# Patient Record
Sex: Female | Born: 1970 | Race: White | Hispanic: No | Marital: Married | Smoking: Never smoker
Health system: Southern US, Community
[De-identification: ages and names within clinical notes are randomized; demographics above are authoritative.]

## PROBLEM LIST (undated history)

## (undated) DIAGNOSIS — T7840XA Allergy, unspecified, initial encounter: Secondary | ICD-10-CM

## (undated) DIAGNOSIS — E079 Disorder of thyroid, unspecified: Secondary | ICD-10-CM

## (undated) DIAGNOSIS — D649 Anemia, unspecified: Secondary | ICD-10-CM

## (undated) DIAGNOSIS — G43909 Migraine, unspecified, not intractable, without status migrainosus: Secondary | ICD-10-CM

## (undated) DIAGNOSIS — I10 Essential (primary) hypertension: Secondary | ICD-10-CM

## (undated) DIAGNOSIS — R011 Cardiac murmur, unspecified: Secondary | ICD-10-CM

## (undated) DIAGNOSIS — F419 Anxiety disorder, unspecified: Secondary | ICD-10-CM

## (undated) HISTORY — PX: TUBAL LIGATION: SHX77

## (undated) HISTORY — PX: WISDOM TOOTH EXTRACTION: SHX21

## (undated) HISTORY — DX: Anxiety disorder, unspecified: F41.9

## (undated) HISTORY — DX: Migraine, unspecified, not intractable, without status migrainosus: G43.909

## (undated) HISTORY — DX: Disorder of thyroid, unspecified: E07.9

## (undated) HISTORY — DX: Allergy, unspecified, initial encounter: T78.40XA

## (undated) HISTORY — DX: Anemia, unspecified: D64.9

## (undated) HISTORY — DX: Cardiac murmur, unspecified: R01.1

## (undated) HISTORY — DX: Essential (primary) hypertension: I10

---

## 1998-07-31 ENCOUNTER — Other Ambulatory Visit: Admission: RE | Admit: 1998-07-31 | Discharge: 1998-07-31 | Payer: Self-pay | Admitting: Obstetrics and Gynecology

## 1999-03-01 ENCOUNTER — Encounter: Admission: RE | Admit: 1999-03-01 | Discharge: 1999-03-01 | Payer: Self-pay | Admitting: *Deleted

## 2000-10-29 ENCOUNTER — Other Ambulatory Visit: Admission: RE | Admit: 2000-10-29 | Discharge: 2000-10-29 | Payer: Self-pay | Admitting: Obstetrics and Gynecology

## 2001-05-14 ENCOUNTER — Inpatient Hospital Stay (HOSPITAL_COMMUNITY): Admission: AD | Admit: 2001-05-14 | Discharge: 2001-05-17 | Payer: Self-pay | Admitting: Obstetrics and Gynecology

## 2001-07-08 ENCOUNTER — Other Ambulatory Visit: Admission: RE | Admit: 2001-07-08 | Discharge: 2001-07-08 | Payer: Self-pay | Admitting: Obstetrics and Gynecology

## 2001-09-07 ENCOUNTER — Emergency Department (HOSPITAL_COMMUNITY): Admission: EM | Admit: 2001-09-07 | Discharge: 2001-09-07 | Payer: Self-pay | Admitting: Emergency Medicine

## 2005-02-27 ENCOUNTER — Inpatient Hospital Stay (HOSPITAL_COMMUNITY): Admission: AD | Admit: 2005-02-27 | Discharge: 2005-02-27 | Payer: Self-pay | Admitting: Obstetrics and Gynecology

## 2005-03-04 ENCOUNTER — Other Ambulatory Visit: Admission: RE | Admit: 2005-03-04 | Discharge: 2005-03-04 | Payer: Self-pay | Admitting: Obstetrics and Gynecology

## 2005-08-29 ENCOUNTER — Inpatient Hospital Stay (HOSPITAL_COMMUNITY): Admission: AD | Admit: 2005-08-29 | Discharge: 2005-08-29 | Payer: Self-pay | Admitting: Obstetrics and Gynecology

## 2005-09-01 ENCOUNTER — Inpatient Hospital Stay (HOSPITAL_COMMUNITY): Admission: AD | Admit: 2005-09-01 | Discharge: 2005-09-01 | Payer: Self-pay | Admitting: Obstetrics and Gynecology

## 2005-09-03 ENCOUNTER — Inpatient Hospital Stay (HOSPITAL_COMMUNITY): Admission: AD | Admit: 2005-09-03 | Discharge: 2005-09-03 | Payer: Self-pay | Admitting: Obstetrics and Gynecology

## 2005-09-08 ENCOUNTER — Inpatient Hospital Stay (HOSPITAL_COMMUNITY): Admission: AD | Admit: 2005-09-08 | Discharge: 2005-09-11 | Payer: Self-pay | Admitting: Obstetrics and Gynecology

## 2005-09-12 ENCOUNTER — Encounter: Admission: RE | Admit: 2005-09-12 | Discharge: 2005-10-11 | Payer: Self-pay | Admitting: Obstetrics and Gynecology

## 2005-09-15 ENCOUNTER — Inpatient Hospital Stay (HOSPITAL_COMMUNITY): Admission: AD | Admit: 2005-09-15 | Discharge: 2005-09-17 | Payer: Self-pay | Admitting: Obstetrics and Gynecology

## 2005-10-12 ENCOUNTER — Encounter: Admission: RE | Admit: 2005-10-12 | Discharge: 2005-11-11 | Payer: Self-pay | Admitting: Obstetrics and Gynecology

## 2005-10-16 LAB — HM PAP SMEAR: HM Pap smear: NEGATIVE

## 2005-11-12 ENCOUNTER — Encounter: Admission: RE | Admit: 2005-11-12 | Discharge: 2005-12-12 | Payer: Self-pay | Admitting: Obstetrics and Gynecology

## 2007-03-23 IMAGING — US US FETAL BPP W/O NONSTRESS
1 series · 14 of 28 positions shown · non-contrast
Comparison: none

CLINICAL DATA: Hypertension.  Edema.

[Series 1: us fetal bpp w/o nonstress · 0.41mm/px · 14 of 35 slices shown]
[im 2/35]
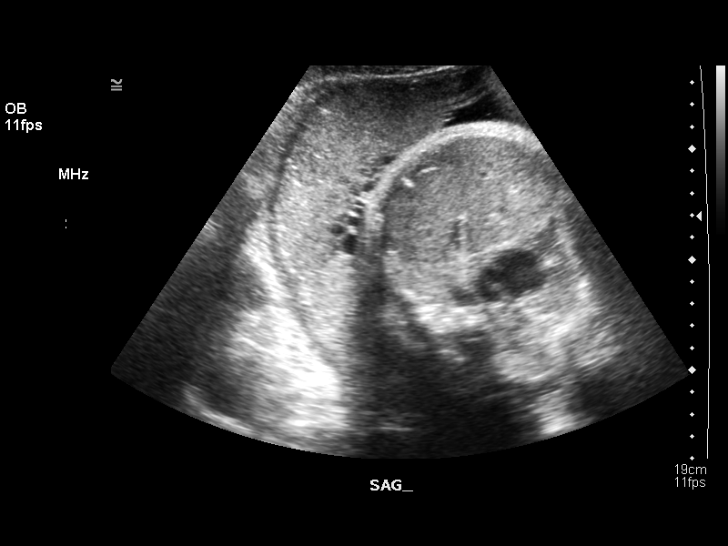
[im 4/35]
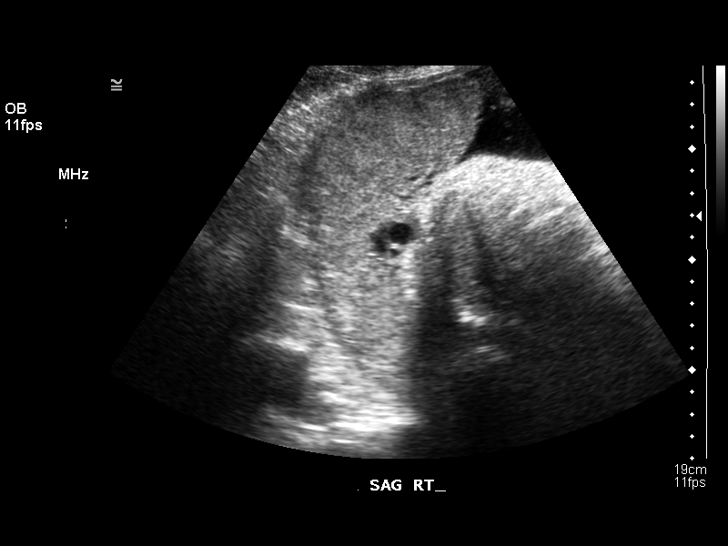
[im 7/35]
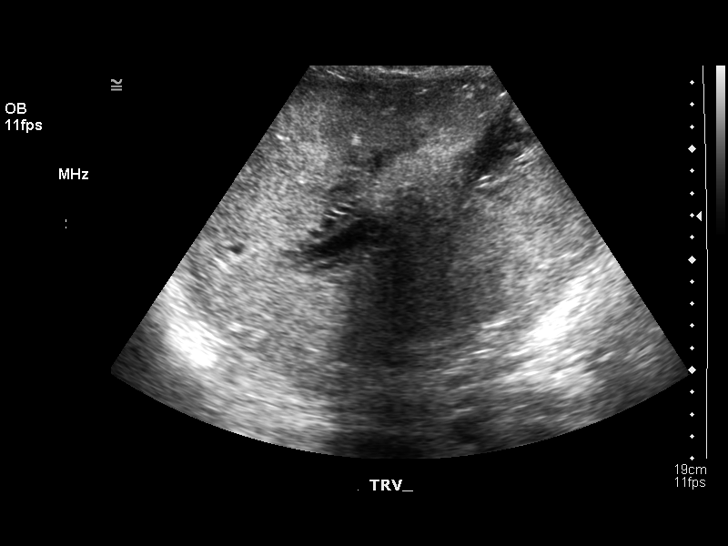
[im 9/35]
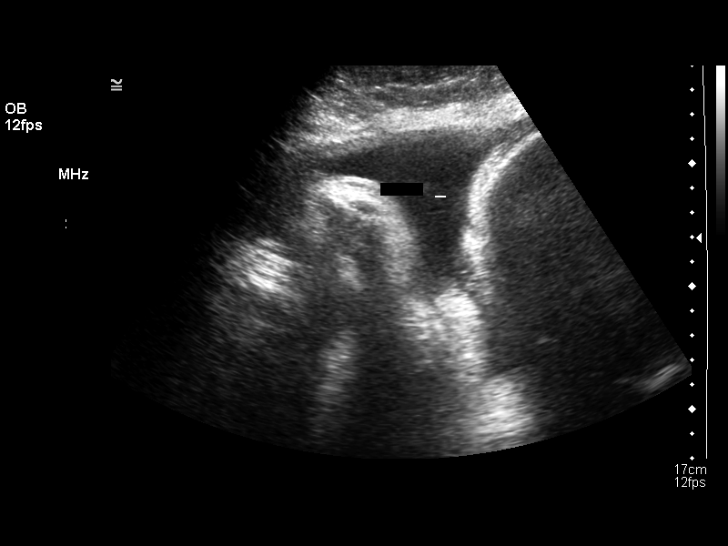
[im 12/35]
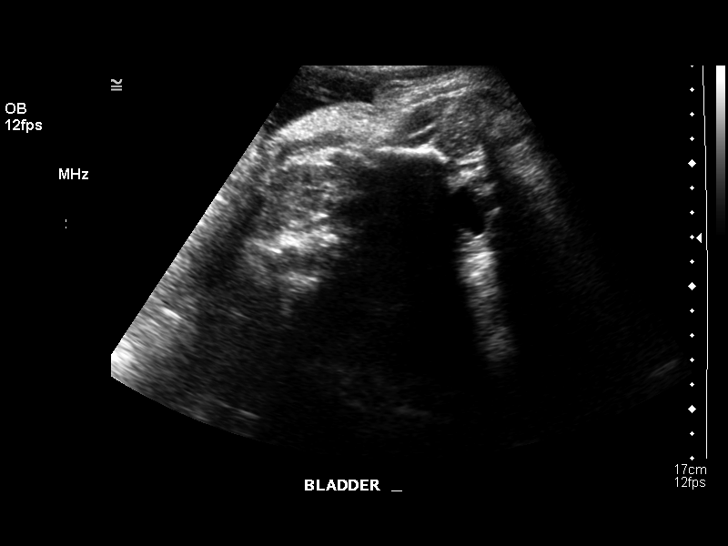
[im 14/35]
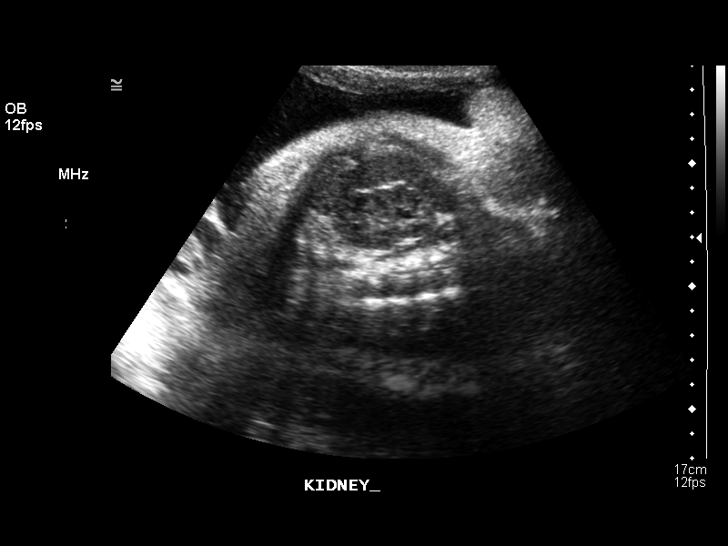
[im 17/35]
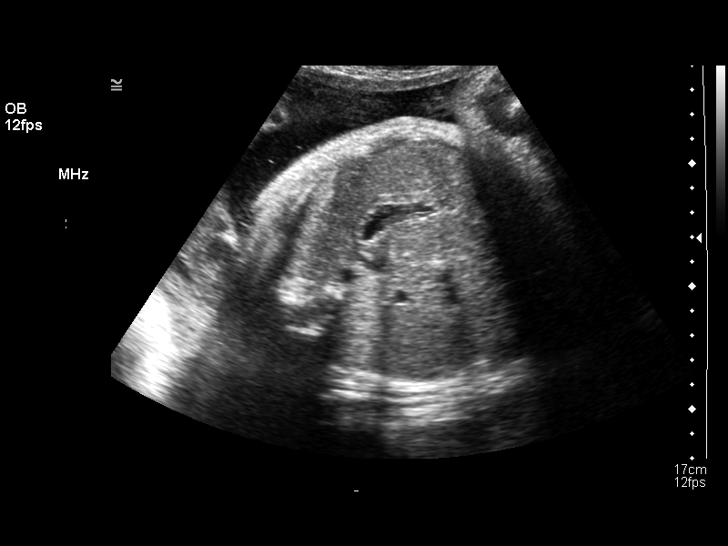
[im 19/35]
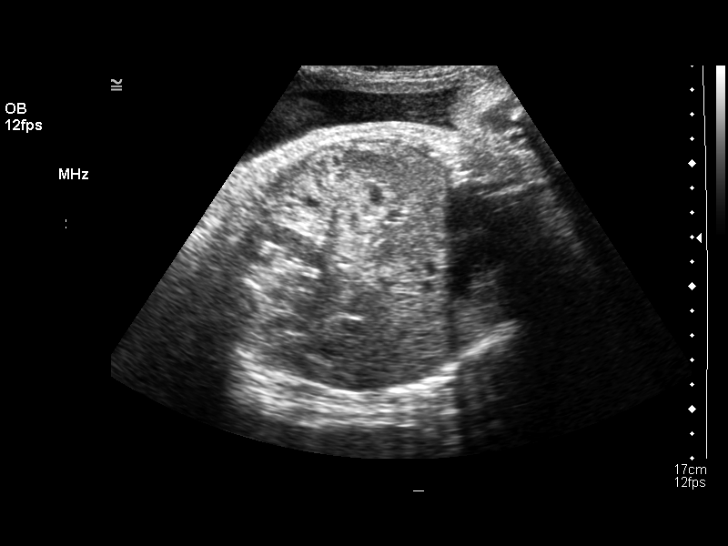
[im 22/35]
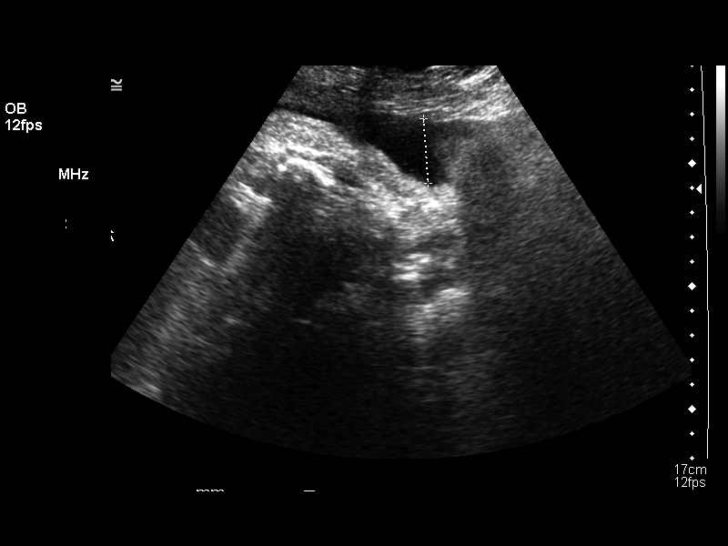
[im 24/35]
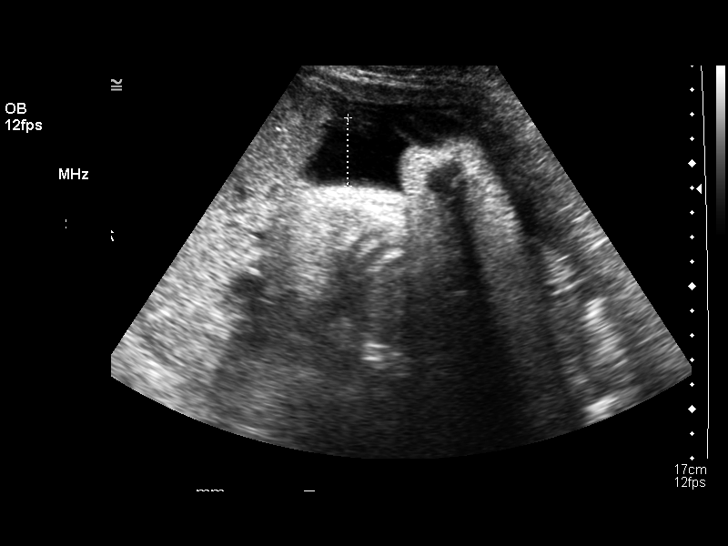
[im 27/35]
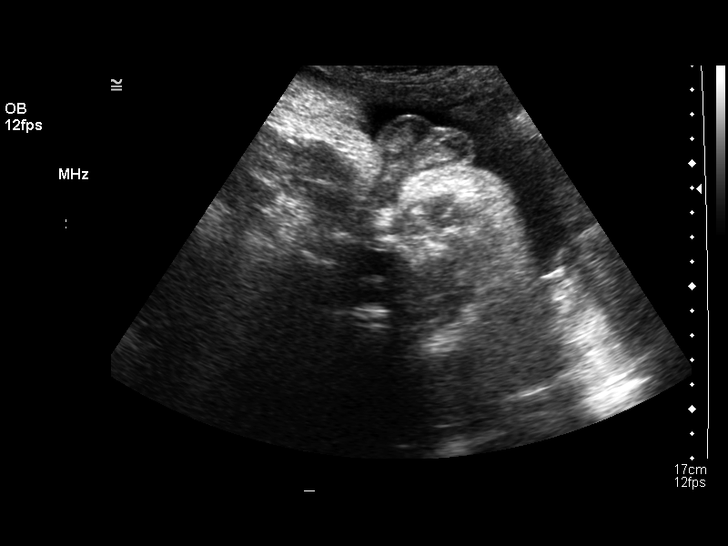
[im 29/35]
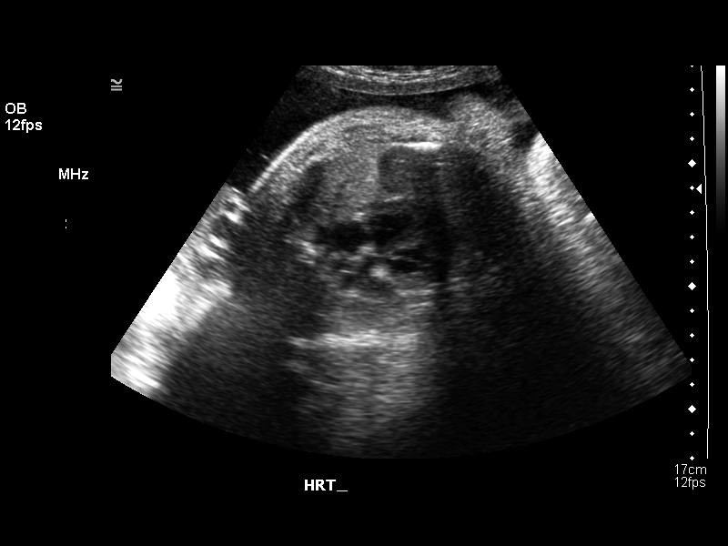
[im 32/35]
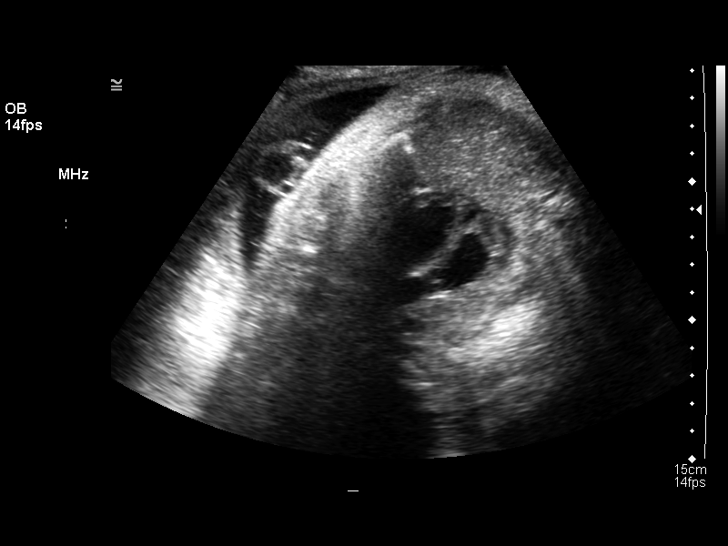
[im 35/35]
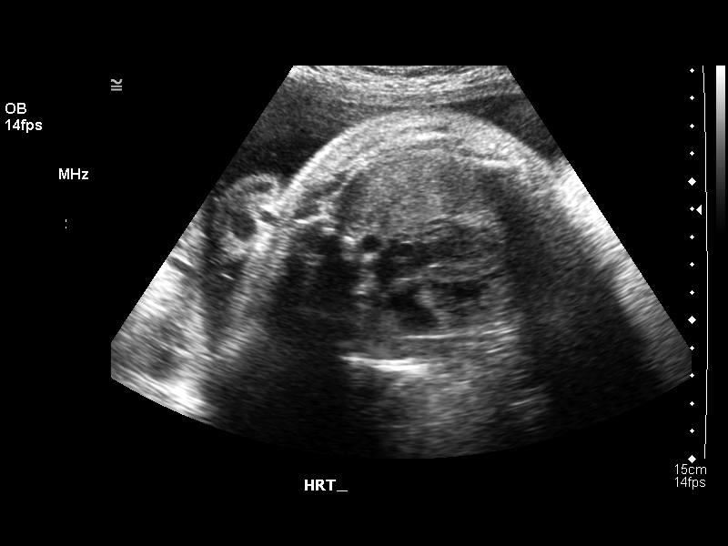

[14 of 28 positions shown; findings below may reference images not displayed]

BIOPHYSICAL PROFILE

 Number of Fetuses: 1
 Heart rate:  126
 Movement:  Yes
 Breathing:  Yes
 Presentation:  Cephalic
 Placental Location:  Fundal, posterior
 Grade: II
 Previa:  No
 Amniotic Fluid (Subjective):  Normal
 Amniotic Fluid (Objective):  10.7 cm AFI (5th -95th%ile = 7.7 ? 24.9 cm for 36 wks)

 Fetal measurements and complete anatomic evaluation were not requested.  The following fetal anatomy was visualized on this exam: Lateral ventricles, four chamber heart, stomach, 3-vessel cord, kidneys, bladder, LVOT, RVOT, orbits, diaphragm, and male genitalia.

 BPP SCORING
 Movements:  2  Time:  10 minutes
 Breathing:  2
 Tone:  2
 Amniotic Fluid: 2
 Total Score: 8

 MATERNAL UTERINE AND ADNEXAL FINDINGS
 Cervix: Not evaluated > 34 wks
IMPRESSION: 1.  Single living intrauterine fetus in cephalic presentation with subjectively and quantitatively normal amniotic fluid volume.
 2.  Visualized fetal anatomy is unremarkable although assessment is limited by fetal position and advanced gestational age.
 3.  Biophysical profile score is [DATE] after 10 minute of observation.

## 2016-06-19 ENCOUNTER — Telehealth: Payer: Self-pay | Admitting: Family

## 2016-06-19 DIAGNOSIS — J329 Chronic sinusitis, unspecified: Secondary | ICD-10-CM

## 2016-06-19 DIAGNOSIS — B9689 Other specified bacterial agents as the cause of diseases classified elsewhere: Secondary | ICD-10-CM

## 2016-06-19 MED ORDER — AMOXICILLIN-POT CLAVULANATE 875-125 MG PO TABS: 1.0000 | ORAL_TABLET | Freq: Two times a day (BID) | ORAL | 0 refills | Status: AC

## 2016-06-19 NOTE — Progress Notes (Signed)

## 2016-09-05 ENCOUNTER — Telehealth: Payer: Self-pay | Admitting: Nurse Practitioner

## 2016-09-05 DIAGNOSIS — J0101 Acute recurrent maxillary sinusitis: Secondary | ICD-10-CM

## 2016-09-05 MED ORDER — DOXYCYCLINE HYCLATE 100 MG PO TABS: 100.0000 mg | ORAL_TABLET | Freq: Two times a day (BID) | ORAL | 0 refills | Status: DC

## 2016-09-05 NOTE — Progress Notes (Signed)
We are sorry that you are not feeling well.  Here is how we plan to help!  Based on what you have shared with me it looks like you have sinusitis.  Sinusitis is inflammation and infection in the sinus cavities of the head.  Based on your presentation I believe you most likely have Acute Bacterial Sinusitis.  This is an infection caused by bacteria and is treated with antibiotics. I have prescribed Doxycycline 137m by mouth twice a day for 10 days. You may use an oral decongestant such as Mucinex D or if you have glaucoma or high blood pressure use plain Mucinex. Saline nasal spray help and can safely be used as often as needed for congestion.  If you develop worsening sinus pain, fever or notice severe headache and vision changes, or if symptoms are not better after completion of antibiotic, please schedule an appointment with a health care provider.    Sinus infections are not as easily transmitted as other respiratory infection, however we still recommend that you avoid close contact with loved ones, especially the very young and elderly.  Remember to wash your hands thoroughly throughout the day as this is the number one way to prevent the spread of infection!  Home Care:  Only take medications as instructed by your medical team.  Complete the entire course of an antibiotic.  Do not take these medications with alcohol.  A steam or ultrasonic humidifier can help congestion.  You can place a towel over your head and breathe in the steam from hot water coming from a faucet.  Avoid close contacts especially the very young and the elderly.  Cover your mouth when you cough or sneeze.  Always remember to wash your hands.  Get Help Right Away If:  You develop worsening fever or sinus pain.  You develop a severe head ache or visual changes.  Your symptoms persist after you have completed your treatment plan.  Make sure you  Understand these instructions.  Will watch your  condition.  Will get help right away if you are not doing well or get worse.  Your e-visit answers were reviewed by a board certified advanced clinical practitioner to complete your personal care plan.  Depending on the condition, your plan could have included both over the counter or prescription medications.  If there is a problem please reply  once you have received a response from your provider.  Your safety is important to [place]  If you have drug allergies check your prescription carefully.    You can use MyChart to ask questions about today's visit, request a non-urgent call back, or ask for a work or school excuse for 24 hours related to this e-Visit. If it has been greater than 24 hours you will need to follow up with your provider, or enter a new e-Visit to address those concerns.  You will get an e-mail in the next two days asking about your experience.  I hope that your e-visit has been valuable and will speed your recovery. Thank you for using e-visits.

## 2016-12-25 ENCOUNTER — Other Ambulatory Visit: Payer: Self-pay | Admitting: Family Medicine

## 2017-01-07 ENCOUNTER — Ambulatory Visit (INDEPENDENT_AMBULATORY_CARE_PROVIDER_SITE_OTHER): Payer: Self-pay | Admitting: Adult Health

## 2017-01-07 ENCOUNTER — Encounter: Payer: Self-pay | Admitting: Adult Health

## 2017-01-07 VITALS — BP 143/89 | HR 88 | Ht 64.5 in | Wt 193.1 lb

## 2017-01-07 DIAGNOSIS — Z0000 Encounter for general adult medical examination without abnormal findings: Secondary | ICD-10-CM | POA: Diagnosis not present

## 2017-01-07 DIAGNOSIS — G43901 Migraine, unspecified, not intractable, with status migrainosus: Secondary | ICD-10-CM | POA: Diagnosis not present

## 2017-01-07 MED ORDER — TOPIRAMATE 25 MG PO TABS: 25.0000 mg | ORAL_TABLET | Freq: Two times a day (BID) | ORAL | 0 refills | Status: DC

## 2017-01-07 MED ORDER — SUMATRIPTAN SUCCINATE 100 MG PO TABS: 100.0000 mg | ORAL_TABLET | ORAL | 1 refills | Status: DC | PRN

## 2017-01-07 NOTE — Patient Instructions (Signed)
Heart-Healthy Eating Plan Many factors influence your heart health, including eating and exercise habits. Heart (coronary) risk increases with abnormal blood fat (lipid) levels. Heart-healthy meal planning includes limiting unhealthy fats, increasing healthy fats, and making other small dietary changes. This includes maintaining a healthy body weight to help keep lipid levels within a normal range. What is my plan? Your health care provider recommends that you:  Get no more than __25__% of the total calories in your daily diet from fat.  Limit your intake of saturated fat to less than __5__% of your total calories each day.  Limit the amount of cholesterol in your diet to less than __300__ mg per day.  What types of fat should I choose?  Choose healthy fats more often. Choose monounsaturated and polyunsaturated fats, such as olive oil and canola oil, flaxseeds, walnuts, almonds, and seeds.  Eat more omega-3 fats. Good choices include salmon, mackerel, sardines, tuna, flaxseed oil, and ground flaxseeds. Aim to eat fish at least two times each week.  Limit saturated fats. Saturated fats are primarily found in animal products, such as meats, butter, and cream. Plant sources of saturated fats include palm oil, palm kernel oil, and coconut oil.  Avoid foods with partially hydrogenated oils in them. These contain trans fats. Examples of foods that contain trans fats are stick margarine, some tub margarines, cookies, crackers, and other baked goods. What general guidelines do I need to follow?  Check food labels carefully to identify foods with trans fats or high amounts of saturated fat.  Fill one half of your plate with vegetables and green salads. Eat 4-5 servings of vegetables per day. A serving of vegetables equals 1 cup of raw leafy vegetables,  cup of raw or cooked cut-up vegetables, or  cup of vegetable juice.  Fill one fourth of your plate with whole grains. Look for the word "whole" as  the first word in the ingredient list.  Fill one fourth of your plate with lean protein foods.  Eat 4-5 servings of fruit per day. A serving of fruit equals one medium whole fruit,  cup of dried fruit,  cup of fresh, frozen, or canned fruit, or  cup of 100% fruit juice.  Eat more foods that contain soluble fiber. Examples of foods that contain this type of fiber are apples, broccoli, carrots, beans, peas, and barley. Aim to get 20-30 g of fiber per day.  Eat more home-cooked food and less restaurant, buffet, and fast food.  Limit or avoid alcohol.  Limit foods that are high in starch and sugar.  Avoid fried foods.  Cook foods by using methods other than frying. Baking, boiling, grilling, and broiling are all great options. Other fat-reducing suggestions include: ? Removing the skin from poultry. ? Removing all visible fats from meats. ? Skimming the fat off of stews, soups, and gravies before serving them. ? Steaming vegetables in water or broth.  Lose weight if you are overweight. Losing just 5-10% of your initial body weight can help your overall health and prevent diseases such as diabetes and heart disease.  Increase your consumption of nuts, legumes, and seeds to 4-5 servings per week. One serving of dried beans or legumes equals  cup after being cooked, one serving of nuts equals 1 ounces, and one serving of seeds equals  ounce or 1 tablespoon.  You may need to monitor your salt (sodium) intake, especially if you have high blood pressure. Talk with your health care provider or dietitian to get  more information about reducing sodium. What foods can I eat? Grains  Breads, including [place], white, pita, wheat, raisin, rye, oatmeal, and [place]. Tortillas that are neither fried nor made with lard or trans fat. Low-fat rolls, including hotdog and hamburger buns and English muffins. Biscuits. Muffins. Waffles. Pancakes. Light popcorn. Whole-grain cereals. Flatbread. Melba toast.  Pretzels. Breadsticks. Rusks. Low-fat snacks and crackers, including oyster, saltine, matzo, graham, animal, and rye. Rice and pasta, including brown rice and those that are made with whole wheat. Vegetables All vegetables. Fruits All fruits, but limit coconut. Meats and Other Protein Sources Lean, well-trimmed beef, veal, pork, and lamb. Chicken and [place] without skin. All fish and shellfish. Wild duck, rabbit, pheasant, and venison. Egg whites or low-cholesterol egg substitutes. Dried beans, peas, lentils, and tofu.Seeds and most nuts. Dairy Low-fat or nonfat cheeses, including ricotta, string, and mozzarella. Skim or 1% milk that is liquid, powdered, or evaporated. Buttermilk that is made with low-fat milk. Nonfat or low-fat yogurt. Beverages Mineral water. Diet carbonated beverages. Sweets and Desserts Sherbets and fruit ices. Honey, jam, marmalade, jelly, and syrups. Meringues and gelatins. Pure sugar candy, such as hard candy, jelly beans, gumdrops, mints, marshmallows, and small amounts of dark chocolate. [employer] Eat all sweets and desserts in moderation. Fats and Oils Nonhydrogenated (trans-free) margarines. Vegetable oils, including soybean, sesame, sunflower, olive, peanut, safflower, corn, canola, and cottonseed. Salad dressings or mayonnaise that are made with a vegetable oil. Limit added fats and oils that you use for cooking, baking, salads, and as spreads. Other Cocoa powder. Coffee and tea. All seasonings and condiments. The items listed above may not be a complete list of recommended foods or beverages. Contact your dietitian for more options. What foods are not recommended? Grains Breads that are made with saturated or trans fats, oils, or whole milk. Croissants. Butter rolls. Cheese breads. Sweet rolls. Donuts. Buttered popcorn. Chow mein noodles. High-fat crackers, such as cheese or butter crackers. Meats and Other Protein Sources Fatty meats, such as hotdogs,  short ribs, sausage, spareribs, bacon, ribeye roast or steak, and mutton. High-fat deli meats, such as salami and bologna. Caviar. Domestic duck and goose. Organ meats, such as kidney, liver, sweetbreads, brains, gizzard, chitterlings, and heart. Dairy Cream, sour cream, cream cheese, and creamed cottage cheese. Whole milk cheeses, including blue (bleu), Monterey Jack, [place], [place], American, [place], Swiss, [place], [place], and [place]. Whole or 2% milk that is liquid, evaporated, or condensed. Whole buttermilk. Cream sauce or high-fat cheese sauce. Yogurt that is made from whole milk. Beverages Regular sodas and drinks with added sugar. Sweets and Desserts Frosting. Pudding. Cookies. Cakes other than angel food cake. Candy that has milk chocolate or white chocolate, hydrogenated fat, butter, coconut, or unknown ingredients. Buttered syrups. Full-fat ice cream or ice cream drinks. Fats and Oils Gravy that has suet, meat fat, or shortening. Cocoa butter, hydrogenated oils, palm oil, coconut oil, palm kernel oil. These can often be found in baked products, candy, fried foods, nondairy creamers, and whipped toppings. Solid fats and shortenings, including bacon fat, salt pork, lard, and butter. Nondairy cream substitutes, such as coffee creamers and sour cream substitutes. Salad dressings that are made of unknown oils, cheese, or sour cream. The items listed above may not be a complete list of foods and beverages to avoid. Contact your dietitian for more information. This information is not intended to replace advice given to you by your health care provider. Make sure you discuss any questions you have with your health care  provider. Document Released: 12/11/2007 Document Revised: 09/21/2015 Document Reviewed: 08/25/2013 Elsevier Interactive Patient Education  2017 [place].   Migraine Headache A migraine headache is an intense, throbbing pain on one side or both sides of the head. Migraines  may also cause other symptoms, such as nausea, vomiting, and sensitivity to light and noise. What are the causes? Doing or taking certain things may also trigger migraines, such as:  Alcohol.  Smoking.  Medicines, such as: ? Medicine used to treat chest pain (nitroglycerine). ? Birth control pills. ? Estrogen pills. ? Certain blood pressure medicines.  Aged cheeses, chocolate, or caffeine.  Foods or drinks that contain nitrates, glutamate, aspartame, or tyramine.  Physical activity.  Other things that may trigger a migraine include:  Menstruation.  Pregnancy.  Hunger.  Stress, lack of sleep, too much sleep, or fatigue.  Weather changes.  What increases the risk? The following factors may make you more likely to experience migraine headaches:  Age. Risk increases with age.  Family history of migraine headaches.  Being Caucasian.  Depression and anxiety.  Obesity.  Being a woman.  Having a hole in the heart (patent foramen ovale) or other heart problems.  What are the signs or symptoms? The main symptom of this condition is pulsating or throbbing pain. Pain may:  Happen in any area of the head, such as on one side or both sides.  Interfere with daily activities.  Get worse with physical activity.  Get worse with exposure to bright lights or loud noises.  Other symptoms may include:  Nausea.  Vomiting.  Dizziness.  General sensitivity to bright lights, loud noises, or smells.  Before you get a migraine, you may get warning signs that a migraine is developing (aura). An aura may include:  Seeing flashing lights or having blind spots.  Seeing bright spots, halos, or zigzag lines.  Having tunnel vision or blurred vision.  Having numbness or a tingling feeling.  Having trouble talking.  Having muscle weakness.  How is this diagnosed? A migraine headache can be diagnosed based on:  Your symptoms.  A physical exam.  Tests, such as CT  scan or MRI of the head. These imaging tests can help rule out other causes of headaches.  Taking fluid from the spine (lumbar puncture) and analyzing it (cerebrospinal fluid analysis, or CSF analysis).  How is this treated? A migraine headache is usually treated with medicines that:  Relieve pain.  Relieve nausea.  Prevent migraines from coming back.  Treatment may also include:  Acupuncture.  Lifestyle changes like avoiding foods that trigger migraines.  Follow these instructions at home: Medicines  Take over-the-counter and prescription medicines only as told by your health care provider.  Do not drive or use heavy machinery while taking prescription pain medicine.  To prevent or treat constipation while you are taking prescription pain medicine, your health care provider may recommend that you: ? Drink enough fluid to keep your urine clear or pale yellow. ? Take over-the-counter or prescription medicines. ? Eat foods that are high in fiber, such as fresh fruits and vegetables, whole grains, and beans. ? Limit foods that are high in fat and processed sugars, such as fried and sweet foods. Lifestyle  Avoid alcohol use.  Do not use any products that contain nicotine or tobacco, such as cigarettes and e-cigarettes. If you need help quitting, ask your health care provider.  Get at least 8 hours of sleep every night.  Limit your stress. General instructions  Keep a journal to find out what may trigger your migraine headaches. For example, write down: ? What you eat and drink. ? How much sleep you get. ? Any change to your diet or medicines.  If you have a migraine: ? Avoid things that make your symptoms worse, such as bright lights. ? It may help to lie down in a dark, quiet room. ? Do not drive or use heavy machinery. ? Ask your health care provider what activities are safe for you while you are experiencing symptoms.  Keep all follow-up visits as told by your  health care provider. This is important. Contact a health care provider if:  You develop symptoms that are different or more severe than your usual migraine symptoms. Get help right away if:  Your migraine becomes severe.  You have a fever.  You have a stiff neck.  You have vision loss.  Your muscles feel weak or like you cannot control them.  You start to lose your balance often.  You develop trouble walking.  You faint. This information is not intended to replace advice given to you by your health care provider. Make sure you discuss any questions you have with your health care provider. Document Released: 03/03/2005 Document Revised: 09/21/2015 Document Reviewed: 08/20/2015 Elsevier Interactive Patient Education  2017 [place] your excellent water intake and follow heart healthy diet. To break through weight loss plateau, recommend  HITT workout 3 times a week in addition to current exercise regime. Please start Topiramate (Topamax) 30m- one tablet nightly for first week, then increase to twice daily week two. Follow-up in 4 weeks. WELCOME TO THE PRACTICE!

## 2017-01-07 NOTE — Progress Notes (Signed)
Subjective:    Patient ID: Susan Barber, female    DOB: 1970/12/21, 46 y.o.   MRN: [id number]  HPI:  Susan Barber is here to establish as a new pt.  She is a very pleasant 46 year old female.  PMH: Migraine HA- estimates >6 HAs/month.  Triggers are salt, stress, and hormonal shifts prior to menses.  She usee Sumatriptan for abortive therapy and has been on topiramate in the past and it worked well for HA prevention. She was managed by [facility], last contact was >2 years ago. She started a regular fitness program and started low cho/high protein diet Feb 2018 and had lost 76lbs-WOW! She performs cardio and wt lifting 5 days a week, however feels that she has hit a plateau with wt loss. She estimates to drink >100 oz water/day. She denies tobacco/ETOH use. She has three children and is a Physicist, medical at local elementary school.  Patient Care Team    Relationship Specialty Notifications Start End  Esaw Grandchild, NP PCP - General Family Medicine  12/25/16     Patient Active Problem List   Diagnosis Date Noted  . Migraine with status migrainosus, not intractable 01/07/2017  . Healthcare maintenance 01/07/2017     Past Medical History:  Diagnosis Date  . Hypertension   . Migraines   . Thyroid disease      Past Surgical History:  Procedure Laterality Date  . CESAREAN SECTION       Family History  Problem Relation Age of Onset  . Depression Mother   . Diabetes Mother   . Hyperlipidemia Mother   . Hypertension Mother   . Alcohol abuse Father   . Hyperlipidemia Maternal Grandmother   . Hypertension Maternal Grandmother   . Cancer Maternal Grandfather        lymphoma  . Heart attack Maternal Grandfather   . Depression Maternal Grandfather   . Hyperlipidemia Maternal Grandfather   . Hypertension Maternal Grandfather      History  Drug Use No     History  Alcohol Use No     History  Smoking Status  . Never Smoker  Smokeless Tobacco  . Never  Used     Outpatient Encounter Prescriptions as of 01/07/2017  Medication Sig  . Collagen Hydrolysate, Bovine, POWD by Does not apply route. One tablespoon twice daily  . SUMAtriptan (IMITREX) 100 MG tablet Take 1 tablet (100 mg total) by mouth every 2 (two) hours as needed for migraine. May repeat in 2 hours if headache persists or recurs.  . [DISCONTINUED] SUMAtriptan (IMITREX) 100 MG tablet Take 100 mg by mouth every 2 (two) hours as needed for migraine. May repeat in 2 hours if headache persists or recurs.  . topiramate (TOPAMAX) 25 MG tablet Take 1 tablet (25 mg total) by mouth 2 (two) times daily.  . [DISCONTINUED] doxycycline (VIBRA-TABS) 100 MG tablet Take 1 tablet (100 mg total) by mouth 2 (two) times daily. 1 po bid   No facility-administered encounter medications on file as of 01/07/2017.     Allergies: Beeswax and Codeine  Body mass index is 32.63 kg/m.  Blood pressure (!) 143/89, pulse 88, height 5' 4.5" (1.638 m), weight 193 lb 1.6 oz (87.6 kg), last menstrual period 12/09/2016.     Review of Systems  Constitutional: Positive for fatigue. Negative for activity change, appetite change, chills, diaphoresis, fever and unexpected weight change.  Eyes: Negative for visual disturbance.  Respiratory: Negative for cough, chest tightness, shortness of  breath, wheezing and stridor.   Cardiovascular: Negative for chest pain, palpitations and leg swelling.  Gastrointestinal: Negative for abdominal distention, abdominal pain, blood in stool, constipation, diarrhea, nausea and vomiting.  Endocrine: Negative for cold intolerance, heat intolerance, polydipsia, polyphagia and polyuria.  Genitourinary: Negative for difficulty urinating, flank pain and hematuria.  Musculoskeletal: Negative for arthralgias, back pain, gait problem, joint swelling, myalgias, neck pain and neck stiffness.  Skin: Negative for color change, pallor, rash and wound.  Neurological: Positive for headaches.  Negative for dizziness.  Hematological: Does not bruise/bleed easily.  Psychiatric/Behavioral: Positive for sleep disturbance. Negative for decreased concentration, self-injury and suicidal ideas. The patient is not nervous/anxious.        Objective:   Physical Exam  Constitutional: She is oriented to person, place, and time. She appears well-developed and well-nourished. No distress.  HENT:  Head: Normocephalic and atraumatic.  Right Ear: External ear normal.  Left Ear: External ear normal.  Cardiovascular: Normal rate, regular rhythm, normal heart sounds and intact distal pulses.   No murmur heard. Pulmonary/Chest: Effort normal and breath sounds normal. No respiratory distress. She has no wheezes. She has no rales. She exhibits no tenderness.  Neurological: She is alert and oriented to person, place, and time.  Skin: Skin is dry. No rash noted. She is not diaphoretic. No erythema. No pallor.  Psychiatric: She has a normal mood and affect. Her behavior is normal. Judgment and thought content normal.  Nursing note and vitals reviewed.         Assessment & Plan:   1. Migraine with status migrainosus, not intractable, unspecified migraine type   2. Healthcare maintenance     Healthcare maintenance Continue excellent water intake and exercise program, recommend HITT exercise 3 days a week to help with wt loss plateau. BP slightly elevated will re-evaluate BP at f/u in 4 weeks.  Migraine with status migrainosus, not intractable Re-stated on Topiramate 37m daily week one, then increase to BID  Sumatriptan refilled F/u in 4 weeks    FOLLOW-UP:  Return in about 4 weeks (around 02/04/2017) for Evaluate Medication Effectiveness, migraine.

## 2017-01-07 NOTE — Assessment & Plan Note (Signed)
Re-stated on Topiramate 53m daily week one, then increase to BID  Sumatriptan refilled F/u in 4 weeks

## 2017-01-07 NOTE — Assessment & Plan Note (Addendum)
Continue excellent water intake and exercise program, recommend HITT exercise 3 days a week to help with wt loss plateau. BP slightly elevated will re-evaluate BP at f/u in 4 weeks.

## 2017-02-09 ENCOUNTER — Encounter: Payer: Self-pay | Admitting: Adult Health

## 2017-02-09 ENCOUNTER — Ambulatory Visit: Payer: BC Managed Care – PPO | Admitting: Adult Health

## 2017-02-09 VITALS — BP 129/83 | HR 79 | Ht 64.5 in | Wt 191.8 lb

## 2017-02-09 DIAGNOSIS — G43901 Migraine, unspecified, not intractable, with status migrainosus: Secondary | ICD-10-CM | POA: Diagnosis not present

## 2017-02-09 MED ORDER — TOPIRAMATE 50 MG PO TABS: 50.0000 mg | ORAL_TABLET | Freq: Two times a day (BID) | ORAL | 1 refills | Status: DC

## 2017-02-09 NOTE — Progress Notes (Signed)
Subjective:    Patient ID: Susan Barber, female    DOB: [birth year], 46 y.o.   MRN: [id number]  HPI:  01/07/17 OV: Susan Barber is here to establish as a new pt.  She is a very pleasant 46 year old female.  PMH: Migraine HA- estimates >6 HAs/month.  Triggers are salt, stress, and hormonal shifts prior to menses.  She usee Sumatriptan for abortive therapy and has been on topiramate in the past and it worked well for HA prevention. She was managed by [facility], last contact was >2 years ago. She started a regular fitness program and started low cho/high protein diet Feb 2018 and had lost 76lbs-WOW! She performs cardio and wt lifting 5 days a week, however feels that she has hit a plateau with wt loss. She estimates to drink >100 oz water/day. She denies tobacco/ETOH use. She has three children and is a Physicist, medical at local elementary school.  02/09/17 OV: Susan Barber is here for f/u: migraines.  She has not needed a single dose of Sumatriptan since re-starting Topiramate 31m BID.  She reports minor numbness/tingling in hands since starting the topiramate, that she says occurred last time she started med and it eventually resolved. She denies CP/dyspnea/palpitations/change in vision/N/V/D. She continues to drink >100 oz water/day and exercises 4 days/week- cardio and weights Patient Care Team    Relationship Specialty Notifications Start End  DEsaw Grandchild NP PCP - General Family Medicine  12/25/16     Patient Active Problem List   Diagnosis Date Noted  . Migraine with status migrainosus, not intractable 01/07/2017  . Healthcare maintenance 01/07/2017     Past Medical History:  Diagnosis Date  . Hypertension   . Migraines   . Thyroid disease      Past Surgical History:  Procedure Laterality Date  . CESAREAN SECTION       Family History  Problem Relation Age of Onset  . Depression Mother   . Diabetes Mother   . Hyperlipidemia Mother   . Hypertension  Mother   . Alcohol abuse Father   . Hyperlipidemia Maternal Grandmother   . Hypertension Maternal Grandmother   . Cancer Maternal Grandfather        lymphoma  . Heart attack Maternal Grandfather   . Depression Maternal Grandfather   . Hyperlipidemia Maternal Grandfather   . Hypertension Maternal Grandfather      Social History   Substance and Sexual Activity  Drug Use No     Social History   Substance and Sexual Activity  Alcohol Use No     Social History   Tobacco Use  Smoking Status Never Smoker  Smokeless Tobacco Never Used     Outpatient Encounter Medications as of 02/09/2017  Medication Sig  . Collagen Hydrolysate, Bovine, POWD by Does not apply route. One tablespoon twice daily  . SUMAtriptan (IMITREX) 100 MG tablet Take 1 tablet (100 mg total) by mouth every 2 (two) hours as needed for migraine. May repeat in 2 hours if headache persists or recurs.  . topiramate (TOPAMAX) 50 MG tablet Take 1 tablet (50 mg total) by mouth 2 (two) times daily.  . [DISCONTINUED] topiramate (TOPAMAX) 25 MG tablet Take 1 tablet (25 mg total) by mouth 2 (two) times daily.   No facility-administered encounter medications on file as of 02/09/2017.     Allergies: Beeswax and Codeine  Body mass index is 32.41 kg/m.  Blood pressure 129/83, pulse 79, height 5' 4.5" (1.638 m), weight 191  lb 12.8 oz (87 kg), last menstrual period 01/09/2017.     Review of Systems  Constitutional: Positive for fatigue. Negative for activity change, appetite change, chills, diaphoresis, fever and unexpected weight change.  Eyes: Negative for visual disturbance.  Respiratory: Negative for cough, chest tightness, shortness of breath, wheezing and stridor.   Cardiovascular: Negative for chest pain, palpitations and leg swelling.  Gastrointestinal: Negative for abdominal distention, abdominal pain, blood in stool, constipation, diarrhea, nausea and vomiting.  Endocrine: Negative for cold intolerance,  heat intolerance, polydipsia, polyphagia and polyuria.  Genitourinary: Negative for difficulty urinating, flank pain and hematuria.  Musculoskeletal: Negative for arthralgias, back pain, gait problem, joint swelling, myalgias, neck pain and neck stiffness.  Skin: Negative for color change, pallor, rash and wound.  Neurological: Positive for headaches. Negative for dizziness.  Hematological: Does not bruise/bleed easily.  Psychiatric/Behavioral: Positive for sleep disturbance. Negative for decreased concentration, self-injury and suicidal ideas. The patient is not nervous/anxious.        Objective:   Physical Exam  Constitutional: She is oriented to person, place, and time. She appears well-developed and well-nourished. No distress.  HENT:  Head: Normocephalic and atraumatic.  Right Ear: External ear normal.  Left Ear: External ear normal.  Cardiovascular: Normal rate, regular rhythm, normal heart sounds and intact distal pulses.  No murmur heard. Pulmonary/Chest: Effort normal and breath sounds normal. No respiratory distress. She has no wheezes. She has no rales. She exhibits no tenderness.  Neurological: She is alert and oriented to person, place, and time.  Skin: Skin is dry. No rash noted. She is not diaphoretic. No erythema. No pallor.  Psychiatric: She has a normal mood and affect. Her behavior is normal. Judgment and thought content normal.  Nursing note and vitals reviewed.         Assessment & Plan:   1. Migraine with status migrainosus, not intractable, unspecified migraine type     Migraine with status migrainosus, not intractable Topiramate increased to 80m BID Continue excellent water intake and regular exercise Does not need RF on sumatriptan     FOLLOW-UP:  Return in about 3 months (around 05/12/2017) for CPE.

## 2017-02-09 NOTE — Assessment & Plan Note (Signed)
Topiramate increased to 93m BID Continue excellent water intake and regular exercise Does not need RF on sumatriptan

## 2017-02-09 NOTE — Patient Instructions (Signed)
Migraine Headache A migraine headache is a very strong throbbing pain on one side or both sides of your head. Migraines can also cause other symptoms. Talk with your doctor about what things may bring on (trigger) your migraine headaches. Follow these instructions at home: Medicines  Take over-the-counter and prescription medicines only as told by your doctor.  Do not drive or use heavy machinery while taking prescription pain medicine.  To prevent or treat constipation while you are taking prescription pain medicine, your doctor may recommend that you: ? Drink enough fluid to keep your pee (urine) clear or pale yellow. ? Take over-the-counter or prescription medicines. ? Eat foods that are high in fiber. These include fresh fruits and vegetables, whole grains, and beans. ? Limit foods that are high in fat and processed sugars. These include fried and sweet foods. Lifestyle  Avoid alcohol.  Do not use any products that contain nicotine or tobacco, such as cigarettes and e-cigarettes. If you need help quitting, ask your doctor.  Get at least 8 hours of sleep every night.  Limit your stress. General instructions   Keep a journal to find out what may bring on your migraines. For example, write down: ? What you eat and drink. ? How much sleep you get. ? Any change in what you eat or drink. ? Any change in your medicines.  If you have a migraine: ? Avoid things that make your symptoms worse, such as bright lights. ? It may help to lie down in a dark, quiet room. ? Do not drive or use heavy machinery. ? Ask your doctor what activities are safe for you.  Keep all follow-up visits as told by your doctor. This is important. Contact a doctor if:  You get a migraine that is different or worse than your usual migraines. Get help right away if:  Your migraine gets very bad.  You have a fever.  You have a stiff neck.  You have trouble seeing.  Your muscles feel weak or like you  cannot control them.  You start to lose your balance a lot.  You start to have trouble walking.  You pass out (faint). This information is not intended to replace advice given to you by your health care provider. Make sure you discuss any questions you have with your health care provider. Document Released: 12/11/2007 Document Revised: 09/21/2015 Document Reviewed: 08/20/2015 Elsevier Interactive Patient Education  2017 [employer].  Topiramate tablets What is this medicine? TOPIRAMATE (toe PYRE a mate) is used to treat seizures in adults or children with epilepsy. It is also used for the prevention of migraine headaches. This medicine may be used for other purposes; ask your health care provider or pharmacist if you have questions. COMMON BRAND NAME(S): Topamax, Topiragen What should I tell my health care provider before I take this medicine? They need to know if you have any of these conditions: -bleeding disorders -cirrhosis of the liver or liver disease -diarrhea -glaucoma -kidney stones or kidney disease -low blood counts, like low white cell, platelet, or red cell counts -lung disease like asthma, obstructive pulmonary disease, emphysema -metabolic acidosis -on a ketogenic diet -schedule for surgery or a procedure -suicidal thoughts, plans, or attempt; a previous suicide attempt by you or a family member -an unusual or allergic reaction to topiramate, other medicines, foods, dyes, or preservatives -pregnant or trying to get pregnant -breast-feeding How should I use this medicine? Take this medicine by mouth with a glass of water. Follow the  directions on the prescription label. Do not crush or chew. You may take this medicine with meals. Take your medicine at regular intervals. Do not take it more often than directed. Talk to your pediatrician regarding the use of this medicine in children. Special care may be needed. While this drug may be prescribed for children as young  as 46 years of age for selected conditions, precautions do apply. Overdosage: If you think you have taken too much of this medicine contact a poison control center or emergency room at once. NOTE: This medicine is only for you. Do not share this medicine with others. What if I miss a dose? If you miss a dose, take it as soon as you can. If your next dose is to be taken in less than 6 hours, then do not take the missed dose. Take the next dose at your regular time. Do not take double or extra doses. What may interact with this medicine? Do not take this medicine with any of the following medications: -probenecid This medicine may also interact with the following medications: -acetazolamide -alcohol -amitriptyline -aspirin and aspirin-like medicines -birth control pills -certain medicines for depression -certain medicines for seizures -certain medicines that treat or prevent blood clots like warfarin, enoxaparin, dalteparin, apixaban, dabigatran, and rivaroxaban -digoxin -hydrochlorothiazide -lithium -medicines for pain, sleep, or muscle relaxation -metformin -methazolamide -NSAIDS, medicines for pain and inflammation, like ibuprofen or naproxen -pioglitazone -risperidone This list may not describe all possible interactions. Give your health care provider a list of all the medicines, herbs, non-prescription drugs, or dietary supplements you use. Also tell them if you smoke, drink alcohol, or use illegal drugs. Some items may interact with your medicine. What should I watch for while using this medicine? Visit your doctor or health care professional for regular checks on your progress. Do not stop taking this medicine suddenly. This increases the risk of seizures if you are using this medicine to control epilepsy. Wear a medical identification bracelet or chain to say you have epilepsy or seizures, and carry a card that lists all your medicines. This medicine can decrease sweating and  increase your body temperature. Watch for signs of deceased sweating or fever, especially in children. Avoid extreme heat, hot baths, and saunas. Be careful about exercising, especially in hot weather. Contact your health care provider right away if you notice a fever or decrease in sweating. You should drink plenty of fluids while taking this medicine. If you have had kidney stones in the past, this will help to reduce your chances of forming kidney stones. If you have stomach pain, with nausea or vomiting and yellowing of your eyes or skin, call your doctor immediately. You may get drowsy, dizzy, or have blurred vision. Do not drive, use machinery, or do anything that needs mental alertness until you know how this medicine affects you. To reduce dizziness, do not sit or stand up quickly, especially if you are an older patient. Alcohol can increase drowsiness and dizziness. Avoid alcoholic drinks. If you notice blurred vision, eye pain, or other eye problems, seek medical attention at once for an eye exam. The use of this medicine may increase the chance of suicidal thoughts or actions. Pay special attention to how you are responding while on this medicine. Any worsening of mood, or thoughts of suicide or dying should be reported to your health care professional right away. This medicine may increase the chance of developing metabolic acidosis. If left untreated, this can cause kidney stones, bone  disease, or slowed growth in children. Symptoms include breathing fast, fatigue, loss of appetite, irregular heartbeat, or loss of consciousness. Call your doctor immediately if you experience any of these side effects. Also, tell your doctor about any surgery you plan on having while taking this medicine since this may increase your risk for metabolic acidosis. Birth control pills may not work properly while you are taking this medicine. Talk to your doctor about using an extra method of birth control. Women who  become pregnant while using this medicine may enroll in the [employer] Pregnancy Registry by calling [phone]. This registry collects information about the safety of antiepileptic drug use during pregnancy. What side effects may I notice from receiving this medicine? Side effects that you should report to your doctor or health care professional as soon as possible: -allergic reactions like skin rash, itching or hives, swelling of the face, lips, or tongue -decreased sweating and/or rise in body temperature -depression -difficulty breathing, fast or irregular breathing patterns -difficulty speaking -difficulty walking or controlling muscle movements -hearing impairment -redness, blistering, peeling or loosening of the skin, including inside the mouth -tingling, pain or numbness in the hands or feet -unusual bleeding or bruising -unusually weak or tired -worsening of mood, thoughts or actions of suicide or dying Side effects that usually do not require medical attention (report to your doctor or health care professional if they continue or are bothersome): -altered taste -back pain, joint or muscle aches and pains -diarrhea, or constipation -headache -loss of appetite -nausea -stomach upset, indigestion -tremors This list may not describe all possible side effects. Call your doctor for medical advice about side effects. You may report side effects to FDA at 1-800-FDA-1088. Where should I keep my medicine? Keep out of the reach of children. Store at room temperature between 15 and 30 degrees C (59 and 86 degrees F) in a tightly closed container. Protect from moisture. Throw away any unused medicine after the expiration date. NOTE: This sheet is a summary. It may not cover all possible information. If you have questions about this medicine, talk to your doctor, pharmacist, or health care provider.  2018 Elsevier/Gold Standard (2013-03-07 23:17:57)  Medication  change- Topiramate 62m twice daily Continue excellent water intake and regular exercise. Please schedule full physical in 3 months. NICE TO SEE YOU!

## 2017-02-25 ENCOUNTER — Other Ambulatory Visit: Payer: Self-pay | Admitting: Adult Health

## 2017-02-26 ENCOUNTER — Telehealth: Payer: Self-pay | Admitting: Adult Health

## 2017-03-04 ENCOUNTER — Telehealth: Payer: BC Managed Care – PPO | Admitting: Nurse Practitioner

## 2017-03-04 ENCOUNTER — Other Ambulatory Visit: Payer: Self-pay | Admitting: Nurse Practitioner

## 2017-03-04 DIAGNOSIS — J0110 Acute frontal sinusitis, unspecified: Secondary | ICD-10-CM

## 2017-03-04 MED ORDER — AMOXICILLIN-POT CLAVULANATE 875-125 MG PO TABS: 1.0000 | ORAL_TABLET | Freq: Two times a day (BID) | ORAL | 0 refills | Status: AC

## 2017-03-04 MED ORDER — FLUTICASONE PROPIONATE 50 MCG/ACT NA SUSP: 2.0000 | Freq: Every day | NASAL | 6 refills | Status: DC

## 2017-03-04 NOTE — Progress Notes (Signed)
Patient sent me a message very upset that medication was not called in. I went back and reviewed questionnaire and decided to send in augmentin rx - 1po bid for 7 days. Message sent to patient.

## 2017-03-04 NOTE — Progress Notes (Signed)
We are sorry that you are not feeling well.  Here is how we plan to help!  Based on what you have shared with me it looks like you have sinusitis.  Sinusitis is inflammation and infection in the sinus cavities of the head.  Based on your presentation I believe you most likely have Acute Viral Sinusitis.This is an infection most likely caused by a virus. There is not specific treatment for viral sinusitis other than to help you with the symptoms until the infection runs its course.  You may use an oral decongestant such as Mucinex D or if you have glaucoma or high blood pressure use plain Mucinex. Saline nasal spray help and can safely be used as often as needed for congestion, I have prescribed: Fluticasone nasal spray two sprays in each nostril once a day  Some authorities believe that zinc sprays or the use of Echinacea may shorten the course of your symptoms.  Sinus infections are not as easily transmitted as other respiratory infection, however we still recommend that you avoid close contact with loved ones, especially the very young and elderly.  Remember to wash your hands thoroughly throughout the day as this is the number one way to prevent the spread of infection!  Home Care:  Only take medications as instructed by your medical team.  Complete the entire course of an antibiotic.  Do not take these medications with alcohol.  A steam or ultrasonic humidifier can help congestion.  You can place a towel over your head and breathe in the steam from hot water coming from a faucet.  Avoid close contacts especially the very young and the elderly.  Cover your mouth when you cough or sneeze.  Always remember to wash your hands.  Get Help Right Away If:  You develop worsening fever or sinus pain.  You develop a severe head ache or visual changes.  Your symptoms persist after you have completed your treatment plan.  Make sure you  Understand these instructions.  Will watch your  condition.  Will get help right away if you are not doing well or get worse.  Your e-visit answers were reviewed by a board certified advanced clinical practitioner to complete your personal care plan.  Depending on the condition, your plan could have included both over the counter or prescription medications.  If there is a problem please reply  once you have received a response from your provider.  Your safety is important to [place].  If you have drug allergies check your prescription carefully.    You can use MyChart to ask questions about today's visit, request a non-urgent call back, or ask for a work or school excuse for 24 hours related to this e-Visit. If it has been greater than 24 hours you will need to follow up with your provider, or enter a new e-Visit to address those concerns.  You will get an e-mail in the next two days asking about your experience.  I hope that your e-visit has been valuable and will speed your recovery. Thank you for using e-visits.

## 2017-03-31 ENCOUNTER — Other Ambulatory Visit: Payer: Self-pay | Admitting: Adult Health

## 2017-06-11 ENCOUNTER — Other Ambulatory Visit: Payer: Self-pay | Admitting: Adult Health

## 2017-07-22 ENCOUNTER — Other Ambulatory Visit: Payer: Self-pay | Admitting: Adult Health

## 2017-07-24 ENCOUNTER — Other Ambulatory Visit: Payer: Self-pay | Admitting: Adult Health

## 2017-08-20 ENCOUNTER — Other Ambulatory Visit: Payer: Self-pay | Admitting: Adult Health

## 2017-08-21 ENCOUNTER — Telehealth: Payer: Self-pay | Admitting: Adult Health

## 2017-08-21 NOTE — Telephone Encounter (Signed)
Called to set up provider required OV for any further Rx refill, automated cellphone message states [facility] full unable to leave message --- will call back later.  --glh

## 2017-08-28 ENCOUNTER — Other Ambulatory Visit: Payer: Self-pay | Admitting: Adult Health

## 2017-09-01 NOTE — Telephone Encounter (Signed)
Good Afternoon Melissa, Yes ma'am- I signed the printed Rx on Monday. Thanks! Valetta Fuller

## 2017-09-01 NOTE — Telephone Encounter (Signed)
I reviewed the chart and it looks like RX was printed on 08/21/2017 but you were out of the office that day and the following week you was out of the office.  Please advise if this is something that has been taken care of.  Thanks. MPulliam, CMA/RT(R)

## 2017-09-02 ENCOUNTER — Other Ambulatory Visit: Payer: Self-pay | Admitting: Adult Health

## 2017-09-03 NOTE — Telephone Encounter (Signed)
Susan Barber, I spoke to [facility] and they have not seen the written RX. Please advise. MPulliam, CMA/RT(R)

## 2017-09-07 ENCOUNTER — Telehealth: Payer: Self-pay | Admitting: Adult Health

## 2017-09-07 NOTE — Telephone Encounter (Signed)
Pt left message regarding provider required appt--- See pt has appt on 09/10/17--glh

## 2017-09-10 ENCOUNTER — Telehealth: Payer: Self-pay | Admitting: Adult Health

## 2017-09-10 ENCOUNTER — Ambulatory Visit (INDEPENDENT_AMBULATORY_CARE_PROVIDER_SITE_OTHER): Payer: BC Managed Care – PPO | Admitting: Adult Health

## 2017-09-10 ENCOUNTER — Encounter: Payer: Self-pay | Admitting: Adult Health

## 2017-09-10 ENCOUNTER — Other Ambulatory Visit: Payer: Self-pay | Admitting: Adult Health

## 2017-09-10 ENCOUNTER — Other Ambulatory Visit: Payer: Self-pay

## 2017-09-10 VITALS — BP 154/83 | HR 74 | Ht 64.5 in | Wt 201.6 lb

## 2017-09-10 DIAGNOSIS — Z6839 Body mass index (BMI) 39.0-39.9, adult: Secondary | ICD-10-CM | POA: Insufficient documentation

## 2017-09-10 DIAGNOSIS — G43901 Migraine, unspecified, not intractable, with status migrainosus: Secondary | ICD-10-CM | POA: Diagnosis not present

## 2017-09-10 DIAGNOSIS — Z6836 Body mass index (BMI) 36.0-36.9, adult: Secondary | ICD-10-CM

## 2017-09-10 DIAGNOSIS — Z6834 Body mass index (BMI) 34.0-34.9, adult: Secondary | ICD-10-CM

## 2017-09-10 DIAGNOSIS — I10 Essential (primary) hypertension: Secondary | ICD-10-CM | POA: Insufficient documentation

## 2017-09-10 DIAGNOSIS — R030 Elevated blood-pressure reading, without diagnosis of hypertension: Secondary | ICD-10-CM

## 2017-09-10 MED ORDER — SUMATRIPTAN SUCCINATE 100 MG PO TABS: ORAL_TABLET | ORAL | 2 refills | Status: DC

## 2017-09-10 MED ORDER — LIRAGLUTIDE -WEIGHT MANAGEMENT 18 MG/3ML ~~LOC~~ SOPN: PEN_INJECTOR | SUBCUTANEOUS | 2 refills | Status: AC

## 2017-09-10 MED ORDER — ZONISAMIDE 100 MG PO CAPS: 100.0000 mg | ORAL_CAPSULE | Freq: Every day | ORAL | 1 refills | Status: DC

## 2017-09-10 MED ORDER — PEN NEEDLES 31G X 6 MM MISC: 1.0000 | Freq: Every day | 0 refills | Status: DC

## 2017-09-10 MED ORDER — CYCLOBENZAPRINE HCL 10 MG PO TABS: 10.0000 mg | ORAL_TABLET | Freq: Every day | ORAL | 1 refills | Status: DC

## 2017-09-10 NOTE — Addendum Note (Signed)
Addended by: Fonnie Mu on: 09/10/2017 10:20 AM   Modules accepted: Orders

## 2017-09-10 NOTE — Telephone Encounter (Signed)
[facility] called pt prior to her leaving OV to advise Rx ordered :  Insulin Pen Needle (PEN NEEDLES) 31G X 6 MM MISC [[id number]]   Order Details  Dose: 1 each Route: Does not apply Frequency: Daily  Dispense Quantity: 100 each Refills: 0 Fills remaining: --        Sig: 1 each by Does not apply route daily. Use one needle daily with Saxenda pens     Not carried at their pharmacy pls switch to different size.  Forwarding message to medical assistant to contact:  Preferred Pharmacies      [id number] [phone](Phone) [phone](Fax)   --glh

## 2017-09-10 NOTE — Assessment & Plan Note (Signed)
Continue Zonisamide 139m QD Continue Sumatriptan 1031mPRN and Cyclobenzaprine 1065mRN Continue regular exercise, excellent hydration, heart healthy diet

## 2017-09-10 NOTE — Progress Notes (Signed)
Subjective:    Patient ID: Susan Barber, female    DOB: Sep 28, 1970, 47 y.o.   MRN: [id number]  Migraine   Pertinent negatives include no abdominal pain, back pain, coughing, dizziness, fever, nausea, neck pain or vomiting.  :  01/07/17 OV: Susan Barber is here to establish as a new pt.  She is a very pleasant 47 year old female.  PMH: Migraine HA- estimates >6 HAs/month.  Triggers are salt, stress, and hormonal shifts prior to menses.  She usee Sumatriptan for abortive therapy and has been on topiramate in the past and it worked well for HA prevention. She was managed by [facility], last contact was >2 years ago. She started a regular fitness program and started low cho/high protein diet Feb 2018 and had lost 76lbs-WOW! She performs cardio and wt lifting 5 days a week, however feels that she has hit a plateau with wt loss. She estimates to drink >100 oz water/day. She denies tobacco/ETOH use. She has three children and is a Physicist, medical at local elementary school.  02/09/17 OV: Susan Barber is here for f/u: migraines.  She has not needed a single dose of Sumatriptan since re-starting Topiramate 97m BID.  She reports minor numbness/tingling in hands since starting the topiramate, that she says occurred last time she started med and it eventually resolved. She denies CP/dyspnea/palpitations/change in vision/N/V/D. She continues to drink >100 oz water/day and exercises 4 days/week- cardio and weights  09/10/17 OV: Susan Barber for f/u: migraine HA [facility], medication changes- Sumatriptan d/c'd, Topiramate d/c'd, and started on Zonisamide 1073mQD and Baclofen PRN Serial Botox injections were recommended. She has been taking Zonisamide 10050mD and has been experiencing 6-7 migraines with Aura/month, reduction from 10-14/month She continues to drink >100 oz water/day, follows a heart healthy diet, and exercises regularly- Cardio  and wt lifting 4 times/week She has gained >10 lbs since last OV in Nov 2018, due to stopping "Keto Diet" She denies CP/dyspnea/palpitations/dizziness. She continues to abstain from tobacco/ETOH use She estimates to drink 2-3 cups coffee/day, this is sig reduction from 3-6 cups/day  Patient Care Team    Relationship Specialty Notifications Start End  DanEsaw GrandchildP PCP - General Family Medicine  12/25/16   [id number]    02/10/17     Patient Active Problem List   Diagnosis Date Noted  . BMI 34.0-34.9,adult 09/10/2017  . Migraine with status migrainosus, not intractable 01/07/2017  . Healthcare maintenance 01/07/2017     Past Medical History:  Diagnosis Date  . Hypertension   . Migraines   . Thyroid disease      Past Surgical History:  Procedure Laterality Date  . CESAREAN SECTION       Family History  Problem Relation Age of Onset  . Depression Mother   . Diabetes Mother   . Hyperlipidemia Mother   . Hypertension Mother   . Alcohol abuse Father   . Hyperlipidemia Maternal Grandmother   . Hypertension Maternal Grandmother   . Cancer Maternal Grandfather        lymphoma  . Heart attack Maternal Grandfather   . Depression Maternal Grandfather   . Hyperlipidemia Maternal Grandfather   . Hypertension Maternal Grandfather      Social History   Substance and Sexual Activity  Drug Use No     Social History   Substance and Sexual Activity  Alcohol Use No  Social History   Tobacco Use  Smoking Status Never Smoker  Smokeless Tobacco Never Used     Outpatient Encounter Medications as of 09/10/2017  Medication Sig  . Collagen Hydrolysate, Bovine, POWD by Does not apply route. One tablespoon twice daily  . SUMAtriptan (IMITREX) 100 MG tablet TAKE 1 TABLET AT THE ONSET OF MIGRAINE. MAY REPEAT IN 2 HRS IF NEEDED.  . [DISCONTINUED] SUMAtriptan (IMITREX) 100 MG tablet TAKE 1 TABLET AT THE ONSET OF MIGRAINE. MAY REPEAT IN 2  HRS IF NEEDED.  PATIENT MUST HAVE OFFICE VISIT PRIOR TO ANY FURTHER REFILLS!  . [DISCONTINUED] zonisamide (ZONEGRAN) 25 MG capsule Take 4 capsules by mouth daily.  . cyclobenzaprine (FLEXERIL) 10 MG tablet Take 1 tablet (10 mg total) by mouth at bedtime.  . Liraglutide -Weight Management (SAXENDA) 18 MG/3ML SOPN Inject 0.6 mg into the skin daily for 7 days, THEN 1.2 mg daily for 7 days, THEN 1.8 mg daily for 7 days, THEN 2.4 mg daily for 7 days, THEN 3 mg daily for 7 days.  Marland Kitchen zonisamide (ZONEGRAN) 100 MG capsule Take 1 capsule (100 mg total) by mouth daily.  . [DISCONTINUED] fluticasone (FLONASE) 50 MCG/ACT nasal spray Place 2 sprays into both nostrils daily.  . [DISCONTINUED] SUMAtriptan (IMITREX) 100 MG tablet TAKE 1 TABLET AT THE ONSET OF MIGRAINE. MAY REPEAT IN 2 HRS IF NEEDED. NEED OFFICE VISIT  . [DISCONTINUED] topiramate (TOPAMAX) 50 MG tablet Take 1 tablet (50 mg total) by mouth 2 (two) times daily.   No facility-administered encounter medications on file as of 09/10/2017.     Allergies: Beeswax and Codeine  Body mass index is 34.07 kg/m.  Blood pressure (!) 154/83, pulse 74, height 5' 4.5" (1.638 m), weight 201 lb 9.6 oz (91.4 kg), last menstrual period 08/18/2017, SpO2 100 %.  Review of Systems  Constitutional: Positive for fatigue. Negative for activity change, appetite change, chills, diaphoresis, fever and unexpected weight change.  Eyes: Negative for visual disturbance.  Respiratory: Negative for cough, chest tightness, shortness of breath, wheezing and stridor.   Cardiovascular: Negative for chest pain, palpitations and leg swelling.  Gastrointestinal: Negative for abdominal distention, abdominal pain, blood in stool, constipation, diarrhea, nausea and vomiting.  Endocrine: Negative for cold intolerance, heat intolerance, polydipsia, polyphagia and polyuria.  Genitourinary: Negative for difficulty urinating, flank pain and hematuria.  Musculoskeletal: Negative for  arthralgias, back pain, gait problem, joint swelling, myalgias, neck pain and neck stiffness.  Skin: Negative for color change, pallor, rash and wound.  Neurological: Positive for headaches. Negative for dizziness.  Hematological: Does not bruise/bleed easily.  Psychiatric/Behavioral: Positive for sleep disturbance. Negative for decreased concentration, self-injury and suicidal ideas. The patient is not nervous/anxious.        Objective:   Physical Exam  Constitutional: She is oriented to person, place, and time. She appears well-developed and well-nourished. No distress.  HENT:  Head: Normocephalic and atraumatic.  Right Ear: External ear normal.  Left Ear: External ear normal.  Cardiovascular: Normal rate, regular rhythm, normal heart sounds and intact distal pulses.  No murmur heard. Pulmonary/Chest: Effort normal and breath sounds normal. No respiratory distress. She has no wheezes. She has no rales. She exhibits no tenderness.  Neurological: She is alert and oriented to person, place, and time.  Skin: Skin is dry. No rash noted. She is not diaphoretic. No erythema. No pallor.  Psychiatric: She has a normal mood and affect. Her behavior is normal. Judgment and thought content normal.  Nursing note and vitals reviewed.  Assessment & Plan:   1. Migraine with status migrainosus, not intractable, unspecified migraine type   2. BMI 34.0-34.9,adult     Migraine with status migrainosus, not intractable Continue Zonisamide 136m QD Continue Sumatriptan 1044mPRN and Cyclobenzaprine 1062mRN Continue regular exercise, excellent hydration, heart healthy diet   BMI 34.0-34.9,adult Wt today 201, >10 lb wt gain since Nov 2018 despite excellent water intake, heart healthy diet, and regular exercise. BP elevated, not candidate for phentermine Started on Saxenda per taper protocol F/u 3 months    FOLLOW-UP:  Return in about 3 months (around 12/11/2017) for Regular Follow Up,  Medical Weight Loss, migraine.

## 2017-09-10 NOTE — Assessment & Plan Note (Signed)
Wt today 201, >10 lb wt gain since Nov 2018 despite excellent water intake, heart healthy diet, and regular exercise. BP elevated, not candidate for phentermine Started on Saxenda per taper protocol F/u 3 months

## 2017-09-10 NOTE — Patient Instructions (Signed)
Mediterranean Diet A Mediterranean diet refers to food and lifestyle choices that are based on the traditions of countries located on the [place]. This way of eating has been shown to help prevent certain conditions and improve outcomes for people who have chronic diseases, like kidney disease and heart disease. What are tips for following this plan? Lifestyle  Cook and eat meals together with your family, when possible.  Drink enough fluid to keep your urine clear or pale yellow.  Be physically active every day. This includes: ? Aerobic exercise like running or swimming. ? Leisure activities like gardening, walking, or housework.  Get 7-8 hours of sleep each night.  If recommended by your health care provider, drink red wine in moderation. This means 1 glass a day for nonpregnant women and 2 glasses a day for men. A glass of wine equals 5 oz (150 mL). Reading food labels  Check the serving size of packaged foods. For foods such as rice and pasta, the serving size refers to the amount of cooked product, not dry.  Check the total fat in packaged foods. Avoid foods that have saturated fat or trans fats.  Check the ingredients list for added sugars, such as corn syrup. Shopping  At the grocery store, buy most of your food from the areas near the walls of the store. This includes: ? Fresh fruits and vegetables (produce). ? Grains, beans, nuts, and seeds. Some of these may be available in unpackaged forms or large amounts (in bulk). ? Fresh seafood. ? Poultry and eggs. ? Low-fat dairy products.  Buy whole ingredients instead of prepackaged foods.  Buy fresh fruits and vegetables in-season from local farmers markets.  Buy frozen fruits and vegetables in resealable bags.  If you do not have access to quality fresh seafood, buy precooked frozen shrimp or canned fish, such as tuna, salmon, or sardines.  Buy small amounts of raw or cooked vegetables, salads, or olives from the  deli or salad bar at your store.  Stock your pantry so you always have certain foods on hand, such as olive oil, canned tuna, canned tomatoes, rice, pasta, and beans. Cooking  Cook foods with extra-virgin olive oil instead of using butter or other vegetable oils.  Have meat as a side dish, and have vegetables or grains as your main dish. This means having meat in small portions or adding small amounts of meat to foods like pasta or stew.  Use beans or vegetables instead of meat in common dishes like chili or lasagna.  Experiment with different cooking methods. Try roasting or broiling vegetables instead of steaming or sauteing them.  Add frozen vegetables to soups, stews, pasta, or rice.  Add nuts or seeds for added healthy fat at each meal. You can add these to yogurt, salads, or vegetable dishes.  Marinate fish or vegetables using olive oil, lemon juice, garlic, and fresh herbs. Meal planning  Plan to eat 1 vegetarian meal one day each week. Try to work up to 2 vegetarian meals, if possible.  Eat seafood 2 or more times a week.  Have healthy snacks readily available, such as: ? Vegetable sticks with hummus. ? Greek yogurt. ? Fruit and nut trail mix.  Eat balanced meals throughout the week. This includes: ? Fruit: 2-3 servings a day ? Vegetables: 4-5 servings a day ? Low-fat dairy: 2 servings a day ? Fish, poultry, or lean meat: 1 serving a day ? Beans and legumes: 2 or more servings a week ? Nuts   and seeds: 1-2 servings a day ? Whole grains: 6-8 servings a day ? Extra-virgin olive oil: 3-4 servings a day  Limit red meat and sweets to only a few servings a month What are my food choices?  Mediterranean diet ? Recommended ? Grains: Whole-grain pasta. Brown rice. Bulgar wheat. Polenta. Couscous. Whole-wheat bread. [place]. ? Vegetables: Artichokes. Beets. Broccoli. Cabbage. Carrots. Eggplant. Green beans. Chard. Kale. Spinach. Onions. Leeks. Peas. Squash.  Tomatoes. Peppers. Radishes. ? Fruits: Apples. Apricots. Avocado. Berries. Bananas. Cherries. Dates. Figs. Grapes. Lemons. Melon. Oranges. Peaches. Plums. Pomegranate. ? Meats and other protein foods: Beans. Almonds. Sunflower seeds. Pine nuts. Peanuts. [place]. Salmon. Scallops. Shrimp. [place]. Tilapia. Clams. Oysters. Eggs. ? Dairy: Low-fat milk. Cheese. Greek yogurt. ? Beverages: Water. Red wine. Herbal tea. ? Fats and oils: Extra virgin olive oil. Avocado oil. Grape seed oil. ? Sweets and desserts: [place] yogurt with honey. Baked apples. Poached pears. Trail mix. ? Seasoning and other foods: Basil. Cilantro. Coriander. Cumin. Mint. Parsley. Sage. Rosemary. Tarragon. Garlic. Oregano. Thyme. Pepper. Balsalmic vinegar. Tahini. Hummus. Tomato sauce. Olives. Mushrooms. ? Limit these ? Grains: Prepackaged pasta or rice dishes. Prepackaged cereal with added sugar. ? Vegetables: Deep fried potatoes (french fries). ? Fruits: Fruit canned in syrup. ? Meats and other protein foods: Beef. Pork. Lamb. Poultry with skin. Hot dogs. [place] Salines. ? Dairy: Ice cream. Sour cream. Whole milk. ? Beverages: Juice. Sugar-sweetened soft drinks. Beer. Liquor and spirits. ? Fats and oils: Butter. Canola oil. Vegetable oil. Beef fat (tallow). Lard. ? Sweets and desserts: Cookies. Cakes. Pies. Candy. ? Seasoning and other foods: Mayonnaise. Premade sauces and marinades. ? The items listed may not be a complete list. Talk with your dietitian about what dietary choices are right for you. Summary  The Mediterranean diet includes both food and lifestyle choices.  Eat a variety of fresh fruits and vegetables, beans, nuts, seeds, and whole grains.  Limit the amount of red meat and sweets that you eat.  Talk with your health care provider about whether it is safe for you to drink red wine in moderation. This means 1 glass a day for nonpregnant women and 2 glasses a day for men. A glass of wine equals 5 oz (150 mL). This information  is not intended to replace advice given to you by your health care provider. Make sure you discuss any questions you have with your health care provider. Document Released: 10/25/2015 Document Revised: 11/27/2015 Document Reviewed: 10/25/2015 Elsevier Interactive Patient Education  2018 [employer].   Migraine Headache A migraine headache is an intense, throbbing pain on one side or both sides of the head. Migraines may also cause other symptoms, such as nausea, vomiting, and sensitivity to light and noise. What are the causes? Doing or taking certain things may also trigger migraines, such as:  Alcohol.  Smoking.  Medicines, such as: ? Medicine used to treat chest pain (nitroglycerine). ? Birth control pills. ? Estrogen pills. ? Certain blood pressure medicines.  Aged cheeses, chocolate, or caffeine.  Foods or drinks that contain nitrates, glutamate, aspartame, or tyramine.  Physical activity.  Other things that may trigger a migraine include:  Menstruation.  Pregnancy.  Hunger.  Stress, lack of sleep, too much sleep, or fatigue.  Weather changes.  What increases the risk? The following factors may make you more likely to experience migraine headaches:  Age. Risk increases with age.  Family history of migraine headaches.  Being Caucasian.  Depression and anxiety.  Obesity.  Being a woman.  Having a hole in the heart (patent foramen ovale) or other heart problems.  What are the signs or symptoms? The main symptom of this condition is pulsating or throbbing pain. Pain may:  Happen in any area of the head, such as on one side or both sides.  Interfere with daily activities.  Get worse with physical activity.  Get worse with exposure to bright lights or loud noises.  Other symptoms may include:  Nausea.  Vomiting.  Dizziness.  General sensitivity to bright lights, loud noises, or smells.  Before you get a migraine, you may get warning signs  that a migraine is developing (aura). An aura may include:  Seeing flashing lights or having blind spots.  Seeing bright spots, halos, or zigzag lines.  Having tunnel vision or blurred vision.  Having numbness or a tingling feeling.  Having trouble talking.  Having muscle weakness.  How is this diagnosed? A migraine headache can be diagnosed based on:  Your symptoms.  A physical exam.  Tests, such as CT scan or MRI of the head. These imaging tests can help rule out other causes of headaches.  Taking fluid from the spine (lumbar puncture) and analyzing it (cerebrospinal fluid analysis, or CSF analysis).  How is this treated? A migraine headache is usually treated with medicines that:  Relieve pain.  Relieve nausea.  Prevent migraines from coming back.  Treatment may also include:  Acupuncture.  Lifestyle changes like avoiding foods that trigger migraines.  Follow these instructions at home: Medicines  Take over-the-counter and prescription medicines only as told by your health care provider.  Do not drive or use heavy machinery while taking prescription pain medicine.  To prevent or treat constipation while you are taking prescription pain medicine, your health care provider may recommend that you: ? Drink enough fluid to keep your urine clear or pale yellow. ? Take over-the-counter or prescription medicines. ? Eat foods that are high in fiber, such as fresh fruits and vegetables, whole grains, and beans. ? Limit foods that are high in fat and processed sugars, such as fried and sweet foods. Lifestyle  Avoid alcohol use.  Do not use any products that contain nicotine or tobacco, such as cigarettes and e-cigarettes. If you need help quitting, ask your health care provider.  Get at least 8 hours of sleep every night.  Limit your stress. General instructions   Keep a journal to find out what may trigger your migraine headaches. For example, write  down: ? What you eat and drink. ? How much sleep you get. ? Any change to your diet or medicines.  If you have a migraine: ? Avoid things that make your symptoms worse, such as bright lights. ? It may help to lie down in a dark, quiet room. ? Do not drive or use heavy machinery. ? Ask your health care provider what activities are safe for you while you are experiencing symptoms.  Keep all follow-up visits as told by your health care provider. This is important. Contact a health care provider if:  You develop symptoms that are different or more severe than your usual migraine symptoms. Get help right away if:  Your migraine becomes severe.  You have a fever.  You have a stiff neck.  You have vision loss.  Your muscles feel weak or like you cannot control them.  You start to lose your balance often.  You develop trouble walking.  You faint. This information is not intended to replace advice given  to you by your health care provider. Make sure you discuss any questions you have with your health care provider. Document Released: 03/03/2005 Document Revised: 09/21/2015 Document Reviewed: 08/20/2015 Elsevier Interactive Patient Education  2017 [place] all medications as directed. Start Saxenda per taper instructions. Continue excellent water intake, follow Mediterranean diet, and continue regular exercise. Follow-up 3 months, sooner if needed. NICE TO SEE YOU!

## 2017-09-24 ENCOUNTER — Telehealth: Payer: BC Managed Care – PPO | Admitting: Family

## 2017-09-24 DIAGNOSIS — J028 Acute pharyngitis due to other specified organisms: Secondary | ICD-10-CM

## 2017-09-24 DIAGNOSIS — B9689 Other specified bacterial agents as the cause of diseases classified elsewhere: Secondary | ICD-10-CM

## 2017-09-24 MED ORDER — AZITHROMYCIN 250 MG PO TABS
ORAL_TABLET | ORAL | 0 refills | Status: DC
Start: 2017-09-24 — End: 2018-03-08

## 2017-09-24 MED ORDER — BENZONATATE 100 MG PO CAPS: 100.0000 mg | ORAL_CAPSULE | Freq: Three times a day (TID) | ORAL | 0 refills | Status: DC | PRN

## 2017-09-24 NOTE — Progress Notes (Signed)
Thank you for the details you included in the comment boxes. Those details are very helpful in determining the best course of treatment for you and help [place] to provide the best care. This may be a sinus infection progressing to pharyngitis. See below.  We are sorry that you are not feeling well.  Here is how we plan to help!  Based on your presentation I believe you most likely have A cough due to bacteria.  When patients have a fever and a productive cough with a change in color or increased sputum production, we are concerned about bacterial bronchitis.  If left untreated it can progress to pneumonia.  If your symptoms do not improve with your treatment plan it is important that you contact your provider.   I have prescribed Azithromyin 250 mg: two tablets now and then one tablet daily for 4 additonal days    In addition you may use A non-prescription cough medication called Mucinex DM: take 2 tablets every 12 hours. and A prescription cough medication called Tessalon Perles 141m. You may take 1-2 capsules every 8 hours as needed for your cough.   From your responses in the eVisit questionnaire you describe inflammation in the upper respiratory tract which is causing a significant cough.  This is commonly called Bronchitis and has four common causes:    Allergies  Viral Infections  Acid Reflux  Bacterial Infection Allergies, viruses and acid reflux are treated by controlling symptoms or eliminating the cause. An example might be a cough caused by taking certain blood pressure medications. You stop the cough by changing the medication. Another example might be a cough caused by acid reflux. Controlling the reflux helps control the cough.  USE OF BRONCHODILATOR ("RESCUE") INHALERS: There is a risk from using your bronchodilator too frequently.  The risk is that over-reliance on a medication which only relaxes the muscles surrounding the breathing tubes can reduce the effectiveness of medications  prescribed to reduce swelling and congestion of the tubes themselves.  Although you feel brief relief from the bronchodilator inhaler, your asthma may actually be worsening with the tubes becoming more swollen and filled with mucus.  This can delay other crucial treatments, such as oral steroid medications. If you need to use a bronchodilator inhaler daily, several times per day, you should discuss this with your provider.  There are probably better treatments that could be used to keep your asthma under control.     HOME CARE . Only take medications as instructed by your medical team. . Complete the entire course of an antibiotic. . Drink plenty of fluids and get plenty of rest. . Avoid close contacts especially the very young and the elderly . Cover your mouth if you cough or cough into your sleeve. . Always remember to wash your hands . A steam or ultrasonic humidifier can help congestion.   GET HELP RIGHT AWAY IF: . You develop worsening fever. . You become short of breath . You cough up blood. . Your symptoms persist after you have completed your treatment plan MAKE SURE YOU   Understand these instructions.  Will watch your condition.  Will get help right away if you are not doing well or get worse.  Your e-visit answers were reviewed by a board certified advanced clinical practitioner to complete your personal care plan.  Depending on the condition, your plan could have included both over the counter or prescription medications. If there is a problem please reply  once you have  received a response from your provider. Your safety is important to [place].  If you have drug allergies check your prescription carefully.    You can use MyChart to ask questions about today's visit, request a non-urgent call back, or ask for a work or school excuse for 24 hours related to this e-Visit. If it has been greater than 24 hours you will need to follow up with your provider, or enter a new e-Visit to  address those concerns. You will get an e-mail in the next two days asking about your experience.  I hope that your e-visit has been valuable and will speed your recovery. Thank you for using e-visits.

## 2017-10-29 ENCOUNTER — Other Ambulatory Visit: Payer: Self-pay | Admitting: Adult Health

## 2017-12-08 ENCOUNTER — Other Ambulatory Visit: Payer: Self-pay | Admitting: Adult Health

## 2017-12-10 ENCOUNTER — Ambulatory Visit: Payer: BC Managed Care – PPO | Admitting: Adult Health

## 2017-12-18 ENCOUNTER — Other Ambulatory Visit: Payer: Self-pay | Admitting: Adult Health

## 2017-12-30 ENCOUNTER — Ambulatory Visit: Payer: BC Managed Care – PPO | Admitting: Adult Health

## 2018-01-11 ENCOUNTER — Ambulatory Visit: Payer: BC Managed Care – PPO | Admitting: Adult Health

## 2018-01-15 ENCOUNTER — Telehealth: Payer: BC Managed Care – PPO | Admitting: Family

## 2018-01-15 DIAGNOSIS — B9689 Other specified bacterial agents as the cause of diseases classified elsewhere: Secondary | ICD-10-CM

## 2018-01-15 DIAGNOSIS — J329 Chronic sinusitis, unspecified: Secondary | ICD-10-CM

## 2018-01-15 MED ORDER — AMOXICILLIN-POT CLAVULANATE 875-125 MG PO TABS: 1.0000 | ORAL_TABLET | Freq: Two times a day (BID) | ORAL | 0 refills | Status: AC

## 2018-01-15 NOTE — Progress Notes (Signed)
Thank you for the details you included in the comment boxes. Those details are very helpful in determining the best course of treatment for you and help [place] to provide the best care.  We are sorry that you are not feeling well.  Here is how we plan to help!  Based on what you have shared with me it looks like you have sinusitis.  Sinusitis is inflammation and infection in the sinus cavities of the head.  Based on your presentation I believe you most likely have Acute Bacterial Sinusitis.  This is an infection caused by bacteria and is treated with antibiotics. I have prescribed Augmentin 883m/125mg one tablet twice daily with food, for 7 days. You may use an oral decongestant such as Mucinex D or if you have glaucoma or high blood pressure use plain Mucinex. Saline nasal spray help and can safely be used as often as needed for congestion.  If you develop worsening sinus pain, fever or notice severe headache and vision changes, or if symptoms are not better after completion of antibiotic, please schedule an appointment with a health care provider.    Sinus infections are not as easily transmitted as other respiratory infection, however we still recommend that you avoid close contact with loved ones, especially the very young and elderly.  Remember to wash your hands thoroughly throughout the day as this is the number one way to prevent the spread of infection!  Home Care:  Only take medications as instructed by your medical team.  Complete the entire course of an antibiotic.  Do not take these medications with alcohol.  A steam or ultrasonic humidifier can help congestion.  You can place a towel over your head and breathe in the steam from hot water coming from a faucet.  Avoid close contacts especially the very young and the elderly.  Cover your mouth when you cough or sneeze.  Always remember to wash your hands.  Get Help Right Away If:  You develop worsening fever or sinus pain.  You  develop a severe head ache or visual changes.  Your symptoms persist after you have completed your treatment plan.  Make sure you  Understand these instructions.  Will watch your condition.  Will get help right away if you are not doing well or get worse.  Your e-visit answers were reviewed by a board certified advanced clinical practitioner to complete your personal care plan.  Depending on the condition, your plan could have included both over the counter or prescription medications.  If there is a problem please reply  once you have received a response from your provider.  Your safety is important to [place]  If you have drug allergies check your prescription carefully.    You can use MyChart to ask questions about today's visit, request a non-urgent call back, or ask for a work or school excuse for 24 hours related to this e-Visit. If it has been greater than 24 hours you will need to follow up with your provider, or enter a new e-Visit to address those concerns.  You will get an e-mail in the next two days asking about your experience.  I hope that your e-visit has been valuable and will speed your recovery. Thank you for using e-visits.

## 2018-01-25 ENCOUNTER — Telehealth: Payer: Self-pay | Admitting: Adult Health

## 2018-01-25 ENCOUNTER — Ambulatory Visit: Payer: BC Managed Care – PPO | Admitting: Adult Health

## 2018-01-25 NOTE — Telephone Encounter (Signed)
[employer] My Meds called inquiring if the PA for Saxenda is still needed for patient, the PA has not been approved but is still active in their system and they called wanting to f/u on this PA. You can reach Cover My Meds at [phone] and use ref code [facility].

## 2018-01-26 ENCOUNTER — Encounter: Payer: Self-pay | Admitting: Adult Health

## 2018-01-26 NOTE — Telephone Encounter (Signed)
LVM for pt to verify if he is still using Saxenda and need for OV prior to PA being renewed.  Charyl Bigger, CMA

## 2018-01-26 NOTE — Telephone Encounter (Signed)
Addressed with pt via [place].  Charyl Bigger, CMA

## 2018-01-27 ENCOUNTER — Telehealth: Payer: Self-pay | Admitting: Adult Health

## 2018-01-27 NOTE — Telephone Encounter (Signed)
Advised CoverMyMeds rep that pt has OV 02/2018 for evaluation and if pt remains on medication at that time, we will seek PA.  Charyl Bigger, CMA

## 2018-01-27 NOTE — Telephone Encounter (Signed)
[employer] rep/ Elta Guadeloupe called from [phone]  To ask if we are pursuing Pre- auth to extend pt's Rx refill on :   SAXENDA 18 MG/3ML SOPN [[id number]]   Order Details  Dose, Route, Frequency: As Directed   Dispense Quantity: 15 pen Refills: 0 Fills remaining: --        Sig: PLEASE SEE ATTACHED FOR DETAILED DIRECTIONS       Written Date: 12/18/17 Expiration Date: 12/18/18    Start Date: 12/18/17 End Date: --       Please called him if any questions @ [place] [phone]  --forwarding message to medical assistant.  --Dion Body

## 2018-01-29 ENCOUNTER — Other Ambulatory Visit: Payer: Self-pay | Admitting: Adult Health

## 2018-02-15 ENCOUNTER — Ambulatory Visit: Payer: BC Managed Care – PPO | Admitting: Adult Health

## 2018-03-08 ENCOUNTER — Ambulatory Visit: Payer: BC Managed Care – PPO | Admitting: Adult Health

## 2018-03-08 ENCOUNTER — Encounter: Payer: Self-pay | Admitting: Neurology

## 2018-03-08 ENCOUNTER — Encounter: Payer: Self-pay | Admitting: Adult Health

## 2018-03-08 VITALS — BP 146/85 | HR 77 | Temp 98.1°F | Ht 64.5 in | Wt 216.3 lb

## 2018-03-08 DIAGNOSIS — Z6836 Body mass index (BMI) 36.0-36.9, adult: Secondary | ICD-10-CM | POA: Diagnosis not present

## 2018-03-08 DIAGNOSIS — Z0000 Encounter for general adult medical examination without abnormal findings: Secondary | ICD-10-CM

## 2018-03-08 DIAGNOSIS — G43101 Migraine with aura, not intractable, with status migrainosus: Secondary | ICD-10-CM | POA: Diagnosis not present

## 2018-03-08 DIAGNOSIS — R030 Elevated blood-pressure reading, without diagnosis of hypertension: Secondary | ICD-10-CM | POA: Diagnosis not present

## 2018-03-08 MED ORDER — LIRAGLUTIDE -WEIGHT MANAGEMENT 18 MG/3ML ~~LOC~~ SOPN: 3.0000 mg | PEN_INJECTOR | Freq: Every day | SUBCUTANEOUS | 5 refills | Status: DC

## 2018-03-08 NOTE — Assessment & Plan Note (Addendum)
Continue all medications as directed-sumatriptan 142m PRN Continue to drink plenty of water and follow Mediterranean diet. Referral to Dr. Jaffe/Neurology placed to address chronic migraine headache.
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
## 2018-03-08 NOTE — Assessment & Plan Note (Signed)
BP 146/85 If BP still elevated at CPE- will start on anti-hypertensive Stressed diet/exercise to normalize weight and lower BP

## 2018-03-08 NOTE — Patient Instructions (Addendum)
Mediterranean Diet A Mediterranean diet refers to food and lifestyle choices that are based on the traditions of countries located on the [employer]. This way of eating has been shown to help prevent certain conditions and improve outcomes for people who have chronic diseases, like kidney disease and heart disease. What are tips for following this plan? Lifestyle  Cook and eat meals together with your family, when possible.  Drink enough fluid to keep your urine clear or pale yellow.  Be physically active every day. This includes: ? Aerobic exercise like running or swimming. ? Leisure activities like gardening, walking, or housework.  Get 7-8 hours of sleep each night. Reading food labels   Check the serving size of packaged foods. For foods such as rice and pasta, the serving size refers to the amount of cooked product, not dry.  Check the total fat in packaged foods. Avoid foods that have saturated fat or trans fats.  Check the ingredients list for added sugars, such as corn syrup. Shopping  At the grocery store, buy most of your food from the areas near the walls of the store. This includes: ? Fresh fruits and vegetables (produce). ? Grains, beans, nuts, and seeds. Some of these may be available in unpackaged forms or large amounts (in bulk). ? Fresh seafood. ? Poultry and eggs. ? Low-fat dairy products.  Buy whole ingredients instead of prepackaged foods.  Buy fresh fruits and vegetables in-season from local farmers markets.  Buy frozen fruits and vegetables in resealable bags.  If you do not have access to quality fresh seafood, buy precooked frozen shrimp or canned fish, such as tuna, salmon, or sardines.  Buy small amounts of raw or cooked vegetables, salads, or olives from the deli or salad bar at your store.  Stock your pantry so you always have certain foods on hand, such as olive oil, canned tuna, canned tomatoes, rice, pasta, and beans. Cooking  Cook  foods with extra-virgin olive oil instead of using butter or other vegetable oils.  Have meat as a side dish, and have vegetables or grains as your main dish. This means having meat in small portions or adding small amounts of meat to foods like pasta or stew.  Use beans or vegetables instead of meat in common dishes like chili or lasagna.  Experiment with different cooking methods. Try roasting or broiling vegetables instead of steaming or sauteing them.  Add frozen vegetables to soups, stews, pasta, or rice.  Add nuts or seeds for added healthy fat at each meal. You can add these to yogurt, salads, or vegetable dishes.  Marinate fish or vegetables using olive oil, lemon juice, garlic, and fresh herbs. Meal planning   Plan to eat 1 vegetarian meal one day each week. Try to work up to 2 vegetarian meals, if possible.  Eat seafood 2 or more times a week.  Have healthy snacks readily available, such as: ? Vegetable sticks with hummus. ? [place] yogurt. ? Fruit and nut trail mix.  Eat balanced meals throughout the week. This includes: ? Fruit: 2-3 servings a day ? Vegetables: 4-5 servings a day ? Low-fat dairy: 2 servings a day ? Fish, poultry, or lean meat: 1 serving a day ? Beans and legumes: 2 or more servings a week ? Nuts and seeds: 1-2 servings a day ? Whole grains: 6-8 servings a day ? Extra-virgin olive oil: 3-4 servings a day  Limit red meat and sweets to only a few servings a month What are  my food choices?  Mediterranean diet ? Recommended ? Grains: Whole-grain pasta. Brown rice. Bulgar wheat. Polenta. Couscous. Whole-wheat bread. [place]. ? Vegetables: Artichokes. Beets. Broccoli. Cabbage. Carrots. Eggplant. Green beans. Chard. Kale. Spinach. Onions. Leeks. Peas. Squash. Tomatoes. Peppers. Radishes. ? Fruits: Apples. Apricots. Avocado. Berries. Bananas. Cherries. Dates. Figs. Grapes. Lemons. Melon. Oranges. Peaches. Plums. Pomegranate. ? Meats and other  protein foods: Beans. Almonds. Sunflower seeds. Pine nuts. Peanuts. [place]. Salmon. Scallops. Shrimp. [place]. Tilapia. Clams. Oysters. Eggs. ? Dairy: Low-fat milk. Cheese. Greek yogurt. ? Beverages: Water. Red wine. Herbal tea. ? Fats and oils: Extra virgin olive oil. Avocado oil. Grape seed oil. ? Sweets and desserts: [place] yogurt with honey. Baked apples. Poached pears. Trail mix. ? Seasoning and other foods: Basil. Cilantro. Coriander. Cumin. Mint. Parsley. Sage. Rosemary. Tarragon. Garlic. Oregano. Thyme. Pepper. Balsalmic vinegar. Tahini. Hummus. Tomato sauce. Olives. Mushrooms. ? Limit these ? Grains: Prepackaged pasta or rice dishes. Prepackaged cereal with added sugar. ? Vegetables: Deep fried potatoes (french fries). ? Fruits: Fruit canned in syrup. ? Meats and other protein foods: Beef. Pork. Lamb. Poultry with skin. Hot dogs. [place] Salines. ? Dairy: Ice cream. Sour cream. Whole milk. ? Beverages: Juice. Sugar-sweetened soft drinks. Beer. Liquor and spirits. ? Fats and oils: Butter. Canola oil. Vegetable oil. Beef fat (tallow). Lard. ? Sweets and desserts: Cookies. Cakes. Pies. Candy. ? Seasoning and other foods: Mayonnaise. Premade sauces and marinades. ? The items listed may not be a complete list. Talk with your dietitian about what dietary choices are right for you. Summary  The Mediterranean diet includes both food and lifestyle choices.  Eat a variety of fresh fruits and vegetables, beans, nuts, seeds, and whole grains.  Limit the amount of red meat and sweets that you eat. This information is not intended to replace advice given to you by your health care provider. Make sure you discuss any questions you have with your health care provider. Document Released: 10/25/2015 Document Revised: 11/27/2015 Document Reviewed: 10/25/2015 Elsevier Interactive Patient Education  2019 [place] all medications as directed. Continue to drink plenty of water and follow Mediterranean  diet. Increase regular exercise.  Recommend at least 30 minutes daily, 5 days per week of walking, jogging, biking, swimming, YouTube/Pinterest workout videos. Referral to Dr. Jaffe/Neurology placed to address chronic migraine headache. Blood pressue 146/85 If blood pressue still elevated at physical, will start on medication. Stressed diet/exercise to normalize weight and lower blood pressure Please schedule complete physical with fasting labs in 6 months. I HOPE YOU FIND YOUR DOGS! HAPPY HOLIDAYS!

## 2018-03-08 NOTE — Assessment & Plan Note (Signed)
Body mass index is 36.55 kg/m.  Currnet wt 216 Continue Liraglutide 59m OD  Increase regular exercise and follow Mediterranean diet

## 2018-03-08 NOTE — Assessment & Plan Note (Signed)
Continue all medications as directed. Continue to drink plenty of water and follow Mediterranean diet. Increase regular exercise.  Recommend at least 30 minutes daily, 5 days per week of walking, jogging, biking, swimming, YouTube/Pinterest workout videos. Referral to Dr. Jaffe/Neurology placed to address chronic migraine headache. Please schedule complete physical with fasting labs in 6 months.

## 2018-03-20 ENCOUNTER — Other Ambulatory Visit: Payer: Self-pay | Admitting: Adult Health

## 2018-03-25 ENCOUNTER — Other Ambulatory Visit: Payer: Self-pay | Admitting: Adult Health

## 2018-03-25 NOTE — Telephone Encounter (Signed)
This is not my pt. thnx

## 2018-03-25 NOTE — Telephone Encounter (Signed)
Pt seen on 03/08/18 and was referred to neurology.  LVM for pt to call to discuss if pt has seen neuro yet.  If so, RXs for migraines need to come from neuro.  Charyl Bigger, CMA

## 2018-03-29 ENCOUNTER — Encounter: Payer: Self-pay | Admitting: Adult Health

## 2018-03-31 ENCOUNTER — Other Ambulatory Visit: Payer: Self-pay | Admitting: Adult Health

## 2018-03-31 MED ORDER — PHENTERMINE HCL 37.5 MG PO CAPS: 37.5000 mg | ORAL_CAPSULE | ORAL | 0 refills | Status: DC

## 2018-03-31 NOTE — Progress Notes (Signed)
Person Controlled Substance Database reviewed- no aberrancies noted Due to weight gain insurance would cover liraglutide Started on Phentermine 37.51m QD F/u 4 weeks

## 2018-04-01 ENCOUNTER — Telehealth: Payer: Self-pay | Admitting: Adult Health

## 2018-04-01 NOTE — Telephone Encounter (Signed)
Terrence Dupont with [employer] is requesting a call back on a PA appeal for patient's Saxenda. She can be reached at [phone] and use ref number [id number]

## 2018-04-01 NOTE — Telephone Encounter (Signed)
Informed CoverMyMeds that we will not be seeking an appeal.  Pt's medication has been changed to phentermine.  Charyl Bigger, CMA
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
## 2018-04-25 ENCOUNTER — Other Ambulatory Visit: Payer: Self-pay | Admitting: Adult Health

## 2018-04-26 ENCOUNTER — Ambulatory Visit: Payer: BC Managed Care – PPO | Admitting: Adult Health

## 2018-04-26 NOTE — Telephone Encounter (Signed)
[place] Controlled Substance Database reviewed.  No aberrancies noted.  Charyl Bigger, CMA

## 2018-04-27 NOTE — Progress Notes (Deleted)
   Subjective:    Patient ID: Susan Barber, female    DOB: [birth year], 48 y.o.   MRN: [id number]  HPI:  Susan Barber presents with   Patient Care Team    Relationship Specialty Notifications Start End  Esaw Grandchild, NP PCP - General Family Medicine  12/25/16   Lady Gary, [facility]    02/10/17     Patient Active Problem List   Diagnosis Date Noted  . BMI 36.0-36.9,adult 09/10/2017  . Elevated blood pressure reading without diagnosis of hypertension 09/10/2017  . Migraine with status migrainosus, not intractable 01/07/2017  . Healthcare maintenance 01/07/2017     Past Medical History:  Diagnosis Date  . Hypertension   . Migraines   . Thyroid disease      Past Surgical History:  Procedure Laterality Date  . CESAREAN SECTION       Family History  Problem Relation Age of Onset  . Depression Mother   . Diabetes Mother   . Hyperlipidemia Mother   . Hypertension Mother   . Alcohol abuse Father   . Hyperlipidemia Maternal Grandmother   . Hypertension Maternal Grandmother   . Cancer Maternal Grandfather        lymphoma  . Heart attack Maternal Grandfather   . Depression Maternal Grandfather   . Hyperlipidemia Maternal Grandfather   . Hypertension Maternal Grandfather      Social History   Substance and Sexual Activity  Drug Use No     Social History   Substance and Sexual Activity  Alcohol Use No     Social History   Tobacco Use  Smoking Status Never Smoker  Smokeless Tobacco Never Used     Outpatient Encounter Medications as of 05/03/2018  Medication Sig  . B-D UF III MINI PEN NEEDLES 31G X 5 MM MISC PLEASE SPECIFY DIRECTIONS, REFILLS AND QUANTITY  . Collagen Hydrolysate, Bovine, POWD by Does not apply route. One tablespoon twice daily  . cyclobenzaprine (FLEXERIL) 10 MG tablet TAKE 1 TABLET BY MOUTH EVERYDAY AT BEDTIME  . phentermine 37.5 MG capsule TAKE 1 CAPSULE BY MOUTH EVERY MORNING  . SUMAtriptan (IMITREX) 100 MG  tablet TAKE 1 TABLET BY MOUTH AT THE ONSET OF MIGRAINE, MAY REPEAT IN 2 HOURS IF NEEDED. MAX 2 TABS/24HRS   No facility-administered encounter medications on file as of 05/03/2018.     Allergies: Beeswax and Codeine  There is no height or weight on file to calculate BMI.  Last menstrual period 08/18/2017.     Review of Systems     Objective:   Physical Exam        Assessment & Plan:  No diagnosis found.  No problem-specific Assessment & Plan notes found for this encounter.    FOLLOW-UP:  No follow-ups on file.

## 2018-05-03 ENCOUNTER — Ambulatory Visit: Payer: BC Managed Care – PPO | Admitting: Adult Health

## 2018-05-10 NOTE — Progress Notes (Signed)
NEUROLOGY CONSULTATION NOTE  MARKEETA SCALF MRN: [id number] DOB: 1970/06/03  Referring provider: [name], NP Primary care provider: [name], NP  Reason for consult:  migraines  HISTORY OF PRESENT ILLNESS: Susan Barber is a 48 year old right-handed woman with hypertension and thyroid disease who presents for migraines.  History supplemented by referring provider note.  Onset:  20s  Location:  Unilateral (either side), rarely in back of head and neck Quality:  Throbbing if severe Intensity:  7/10.  She denies new headache, thunderclap headache or severe headache that wakes her from sleep. Aura:  no Premonitory Phase:  no Postdrome:  lethargy Associated symptoms:  Photophobia, phonophobia, vertigo, sometimes nausea.  She denies associated visual disturbance, autonomic symptoms or unilateral numbness or weakness. Duration:  If sumatriptan take early, then an hour; if wakes up with a migraine, it may take 2 hours Frequency:  Initially 10 to 12 days a month, over past 2 months they occurred 3 or 4 days a month (she attributes this to diet, hydration and exercise) Frequency of abortive medication:3 or 4 times a month. Triggers:  Salt, emotional stress, menses Relieving factors:  none Activity:  aggravates  Rescue protocol:  If mild, takes 2 naproxen.  If more severe, then sumatriptan.  If she wakes up with migraine, she takes sumatriptan with naproxen. Current NSAIDS:  Naproxen  Current analgesics:  none Current triptans:  Sumatriptan 140m Current ergotamine:  none Current anti-emetic:  none Current muscle relaxants:  Flexeril Current anti-anxiolytic:  none Current sleep aide:  none Current Antihypertensive medications:  none Current Antidepressant medications:  none Current Anticonvulsant medications:  none Current anti-CGRP:  none Current Vitamins/Herbal/Supplements:  none Current Antihistamines/Decongestants:  none Other therapy:  none Hormone/birth  control:  none Other medications:  none  Past NSAIDS:  Advil Past analgesics:  Tylenol, Excedrin Past abortive triptans:  none Past abortive ergotamine:  none Past muscle relaxants:  none Past anti-emetic:  none Past antihypertensive medications:  none Past antidepressant medications:  none Past anticonvulsant medications:  topiramate 1026m(effective but caused paresthesias), zonisamide 10033mast anti-CGRP:  none Past vitamins/Herbal/Supplements:  none Past antihistamines/decongestants:  none Other past therapies:  Continuous hormone therapy  Caffeine:  3 to 5 cups of coffee daily Alcohol:  No Smoker:  No Diet:  Mediterranean diet. Drinks over 100 oz water daily Exercise:  Cardio and weight lifting 5 days a week Depression:  no; Anxiety:  no Other pain:  Low back pain Sleep hygiene:  Poor.  4-5 hours a night Family history of headache:  Grandmother, grandmother's sister, her 3 children  PAST MEDICAL HISTORY: Past Medical History:  Diagnosis Date  . Hypertension   . Migraines   . Thyroid disease     PAST SURGICAL HISTORY: Past Surgical History:  Procedure Laterality Date  . CESAREAN SECTION      MEDICATIONS: Current Outpatient Medications on File Prior to Visit  Medication Sig Dispense Refill  . B-D UF III MINI PEN NEEDLES 31G X 5 MM MISC PLEASE SPECIFY DIRECTIONS, REFILLS AND QUANTITY 100 each 3  . Collagen Hydrolysate, Bovine, POWD by Does not apply route. One tablespoon twice daily    . cyclobenzaprine (FLEXERIL) 10 MG tablet TAKE 1 TABLET BY MOUTH EVERYDAY AT BEDTIME 30 tablet 1  . phentermine 37.5 MG capsule TAKE 1 CAPSULE BY MOUTH EVERY MORNING 30 capsule 0  . SUMAtriptan (IMITREX) 100 MG tablet TAKE 1 TABLET BY MOUTH AT THE ONSET OF MIGRAINE, MAY REPEAT IN 2 HOURS IF NEEDED.  MAX 2 TABS/24HRS 10 tablet 0   No current facility-administered medications on file prior to visit.     ALLERGIES: Allergies  Allergen Reactions  . Beeswax Other (See Comments)  .  Codeine Nausea And Vomiting    Headaches.    FAMILY HISTORY: Family History  Problem Relation Age of Onset  . Depression Mother   . Diabetes Mother   . Hyperlipidemia Mother   . Hypertension Mother   . Alcohol abuse Father   . Hyperlipidemia Maternal Grandmother   . Hypertension Maternal Grandmother   . Cancer Maternal Grandfather        lymphoma  . Heart attack Maternal Grandfather   . Depression Maternal Grandfather   . Hyperlipidemia Maternal Grandfather   . Hypertension Maternal Grandfather    SOCIAL HISTORY: Social History   Socioeconomic History  . Marital status: Married    Spouse name: Not on file  . Number of children: Not on file  . Years of education: Not on file  . Highest education level: Not on file  Occupational History  . Not on file  Social Needs  . Financial resource strain: Not on file  . Food insecurity:    Worry: Not on file    Inability: Not on file  . Transportation needs:    Medical: Not on file    Non-medical: Not on file  Tobacco Use  . Smoking status: Never Smoker  . Smokeless tobacco: Never Used  Substance and Sexual Activity  . Alcohol use: No  . Drug use: No  . Sexual activity: Yes    Birth control/protection: Surgical  Lifestyle  . Physical activity:    Days per week: Not on file    Minutes per session: Not on file  . Stress: Not on file  Relationships  . Social connections:    Talks on phone: Not on file    Gets together: Not on file    Attends religious service: Not on file    Active member of club or organization: Not on file    Attends meetings of clubs or organizations: Not on file    Relationship status: Not on file  . Intimate partner violence:    Fear of current or ex partner: Not on file    Emotionally abused: Not on file    Physically abused: Not on file    Forced sexual activity: Not on file  Other Topics Concern  . Not on file  Social History Narrative  . Not on file    REVIEW OF  SYSTEMS: Constitutional: No fevers, chills, or sweats, no generalized fatigue, change in appetite Eyes: No visual changes, double vision, eye pain Ear, nose and throat: No hearing loss, ear pain, nasal congestion, sore throat Cardiovascular: No chest pain, palpitations Respiratory:  No shortness of breath at rest or with exertion, wheezes GastrointestinaI: No nausea, vomiting, diarrhea, abdominal pain, fecal incontinence Genitourinary:  No dysuria, urinary retention or frequency Musculoskeletal:  No neck pain, back pain Integumentary: No rash, pruritus, skin lesions Neurological: as above Psychiatric: No depression, insomnia, anxiety Endocrine: No palpitations, fatigue, diaphoresis, mood swings, change in appetite, change in weight, increased thirst Hematologic/Lymphatic:  No purpura, petechiae. Allergic/Immunologic: no itchy/runny eyes, nasal congestion, recent allergic reactions, rashes  PHYSICAL EXAM: Blood pressure 136/82, pulse 90, height 5' 5.5" (1.664 m), weight 222 lb (100.7 kg), last menstrual period 08/18/2017, SpO2 99 %. General: No acute distress.  Patient appears well-groomed.   Head:  Normocephalic/atraumatic Eyes:  fundi examined but not visualized Neck:  supple, no paraspinal tenderness, full range of motion Back: No paraspinal tenderness Heart: regular rate and rhythm Lungs: Clear to auscultation bilaterally. Vascular: No carotid bruits. Neurological Exam: Mental status: alert and oriented to person, place, and time, recent and remote memory intact, fund of knowledge intact, attention and concentration intact, speech fluent and not dysarthric, language intact. Cranial nerves: CN I: not tested CN II: pupils equal, round and reactive to light, visual fields intact CN III, IV, VI:  full range of motion, no nystagmus, no ptosis CN V: facial sensation intact CN VII: upper and lower face symmetric CN VIII: hearing intact CN IX, X: gag intact, uvula midline CN XI:  sternocleidomastoid and trapezius muscles intact CN XII: tongue midline Bulk & Tone: normal, no fasciculations. Motor:  5/5 throughout  Sensation: temperature and vibration sensation intact. Deep Tendon Reflexes:  2+ throughout, toes downgoing.   Finger to nose testing:  Without dysmetria.   Heel to shin:  Without dysmetria.   Gait:  Normal station and stride.  Able to turn and tandem walk. Romberg negative.  IMPRESSION: Migraine without aura, without status migrainosus, not intractable.  Much improved after lifestyle modification.  PLAN: 1.  For preventative management, she will continue her current lifestyle regimen 2.  For abortive therapy, sumatriptan; sumatriptan with naproxen if she wakes up with migraine. 3.  Limit use of pain relievers to no more than 2 days out of week to prevent risk of rebound or medication-overuse headache. 4.  Keep headache diary 5.  Exercise, hydration, caffeine cessation, sleep hygiene, monitor for and avoid triggers 6.  Consider:  magnesium citrate 425m daily, riboflavin 4034mdaily, and coenzyme Q10 10041mhree times daily 7.  Follow up as needed.  If headaches worsen, we can try Trokendi/Qudexi (which less likely causes paresthesias) or one of the anti-CGRPs  Thank you for allowing me to take part in the care of this patient.  AdaMetta ClinesO  CC: KatMina MarbleP

## 2018-05-12 ENCOUNTER — Encounter: Payer: Self-pay | Admitting: Neurology

## 2018-05-12 ENCOUNTER — Ambulatory Visit: Payer: BC Managed Care – PPO | Admitting: Adult Health

## 2018-05-12 ENCOUNTER — Ambulatory Visit: Payer: BC Managed Care – PPO | Admitting: Neurology

## 2018-05-12 VITALS — BP 136/82 | HR 90 | Ht 65.5 in | Wt 222.0 lb

## 2018-05-12 DIAGNOSIS — G43009 Migraine without aura, not intractable, without status migrainosus: Secondary | ICD-10-CM | POA: Diagnosis not present

## 2018-05-12 NOTE — Patient Instructions (Signed)
Migraine Recommendations: 1.  It seems like you got your migraines controlled on your own.  I wouldn't start a preventative mediation at this time. 2.  Take sumatriptan 11m at earliest onset of headache.  May repeat dose once in 2 hours if needed.  Do not exceed two tablets in 24 hours.  If you wake up with migraine, take sumatriptan with naproxen (may repeat in 2 hours if needed) 3.  Limit use of pain relievers to no more than 2 days out of the week.  These medications include acetaminophen, ibuprofen, triptans and narcotics.  This will help reduce risk of rebound headaches. 4.  Be aware of common food triggers such as processed sweets, processed foods with nitrites (such as deli meat, hot dogs, sausages), foods with MSG, alcohol (such as wine), chocolate, certain cheeses, certain fruits (dried fruits, bananas, pineapple), vinegar, diet soda. 4.  Avoid caffeine 5.  Routine exercise 6.  Proper sleep hygiene 7.  Stay adequately hydrated with water 8.  Keep a headache diary. 9.  Maintain proper stress management. 10.  Do not skip meals. 11.  Consider supplements:  Magnesium citrate 4046mto 60070maily, riboflavin 400m25moenzyme Q 10 100mg47mee times daily 12.  Follow up as needed.  If you have an increase in migraines, contact me for suggestion and make a follow up appointment.

## 2018-05-20 ENCOUNTER — Telehealth: Payer: BC Managed Care – PPO | Admitting: Physician Assistant

## 2018-05-20 DIAGNOSIS — B9689 Other specified bacterial agents as the cause of diseases classified elsewhere: Secondary | ICD-10-CM | POA: Diagnosis not present

## 2018-05-20 DIAGNOSIS — J019 Acute sinusitis, unspecified: Secondary | ICD-10-CM

## 2018-05-20 MED ORDER — AMOXICILLIN-POT CLAVULANATE 875-125 MG PO TABS: 1.0000 | ORAL_TABLET | Freq: Two times a day (BID) | ORAL | 0 refills | Status: DC

## 2018-05-20 NOTE — Progress Notes (Signed)

## 2018-05-20 NOTE — Progress Notes (Signed)
I have spent 5 minutes in review of e-visit questionnaire, review and updating patient chart, medical decision making and response to patient.   Altovise Wahler Cody Shebra Muldrow, PA-C    

## 2018-05-24 ENCOUNTER — Other Ambulatory Visit: Payer: Self-pay | Admitting: Adult Health

## 2018-05-25 ENCOUNTER — Other Ambulatory Visit: Payer: Self-pay

## 2018-05-25 ENCOUNTER — Ambulatory Visit: Payer: BC Managed Care – PPO | Admitting: Adult Health

## 2018-05-25 DIAGNOSIS — G43009 Migraine without aura, not intractable, without status migrainosus: Secondary | ICD-10-CM

## 2018-05-25 MED ORDER — NAPROXEN 500 MG PO TABS: 500.0000 mg | ORAL_TABLET | Freq: Two times a day (BID) | ORAL | 2 refills | Status: DC | PRN

## 2018-05-25 MED ORDER — SUMATRIPTAN SUCCINATE 100 MG PO TABS: 100.0000 mg | ORAL_TABLET | Freq: Once | ORAL | 2 refills | Status: DC | PRN

## 2018-06-05 ENCOUNTER — Other Ambulatory Visit: Payer: Self-pay | Admitting: Adult Health

## 2018-06-07 NOTE — Telephone Encounter (Signed)
3/3 of phentermine [place] Controlled Substance Database reviewed- no aberrancies noted Refill sent in Thanks! Valetta Fuller

## 2018-06-07 NOTE — Telephone Encounter (Signed)
Last filled 04/26/2018  LOV 02/16/2018. MPulliam, CMA/RT(R)

## 2018-07-19 ENCOUNTER — Other Ambulatory Visit: Payer: Self-pay | Admitting: Adult Health

## 2018-07-20 NOTE — Telephone Encounter (Signed)
Good Afternoon Tonya, Can you please call Ms. Cranmore- She needs OV for BP check and weight If BP is okay and she has lost weight, she can have one final three round of phentermine Her last OV 02/2018 Thanks! Valetta Fuller

## 2018-07-21 NOTE — Telephone Encounter (Signed)
Advised pt of need for f/u prior for further refills of phentermine.  Pt stated that she would "think about it" and that she "may go somewhere else".  Advised pt to call back if she'd like to schedule a f/u.  Charyl Bigger, CMA

## 2018-09-03 ENCOUNTER — Other Ambulatory Visit: Payer: BC Managed Care – PPO

## 2018-09-06 ENCOUNTER — Encounter: Payer: Self-pay | Admitting: Adult Health

## 2018-09-07 ENCOUNTER — Encounter: Payer: Self-pay | Admitting: Adult Health

## 2018-09-07 ENCOUNTER — Ambulatory Visit (INDEPENDENT_AMBULATORY_CARE_PROVIDER_SITE_OTHER): Payer: BC Managed Care – PPO | Admitting: Adult Health

## 2018-09-07 ENCOUNTER — Other Ambulatory Visit: Payer: Self-pay

## 2018-09-07 VITALS — BP 141/82 | Temp 97.6°F | Ht 65.5 in | Wt 238.0 lb

## 2018-09-07 DIAGNOSIS — R5383 Other fatigue: Secondary | ICD-10-CM

## 2018-09-07 DIAGNOSIS — Z0000 Encounter for general adult medical examination without abnormal findings: Secondary | ICD-10-CM | POA: Diagnosis not present

## 2018-09-07 DIAGNOSIS — R635 Abnormal weight gain: Secondary | ICD-10-CM

## 2018-09-07 DIAGNOSIS — Z6839 Body mass index (BMI) 39.0-39.9, adult: Secondary | ICD-10-CM

## 2018-09-07 DIAGNOSIS — G43901 Migraine, unspecified, not intractable, with status migrainosus: Secondary | ICD-10-CM | POA: Diagnosis not present

## 2018-09-07 NOTE — Assessment & Plan Note (Signed)
Gradual Weight gain since Nov, she estimates it to >46 lbs Current 238 Body mass index is 39 kg/m.  She estimates to drink between 110-150 oz water/day. She consumes a low CHO/lean protein diet. She walks 1.5-2 miles everyday. She reports her mother having T2D, HTN, and Obesity. She was briefly on Liraglutide for weight loss Fall 2019 She has never had fasting labs completed- advised that this needs to be completed ASAP Weight gain may be related to Thyroid Diease, Vit D Def, Diabetes

## 2018-09-07 NOTE — Assessment & Plan Note (Signed)
3) Migraine HA- She estimates 5-7 HAs per month She now treats with Naproxen 541m first and if pain remains will use Sumatriptan 1025m  Topiramate helped with HA reductio, however caused paresthesias. She is followed by Dr. [facility]Marvell [facility]- F/u PRN

## 2018-09-07 NOTE — Progress Notes (Signed)
Virtual Visit via Video Note  I connected with [name] on 09/07/18 at  1:45 PM EDT by a video enabled telemedicine application and verified that I am speaking with the correct person using two identifiers.  Location: Patient: Susan Barber   I discussed the limitations of evaluation and management by telemedicine and the availability of in person appointments. The patient expressed understanding and agreed to proceed.  History of Present Illness: Susan Barber is using WebEx today to discuss severall concerns- 1) Gradual Weight gain since Nov, she estimates it to >46 lbs Current 238 Body mass index is 39 kg/m.  She estimates to drink between 110-150 oz water/day. She consumes a low CHO/lean protein diet. She walks 1.5-2 miles everyday. She reports her mother having T2D, HTN, and Obesity. She was briefly on Liraglutide for weight loss Fall 2019 She has never had fasting labs completed- advised that this needs to be completed ASAP Weight gain may be related to Thyroid Diease, Vit D Def, Diabetes 2) Diffuse joint pain- Bil hands and R hip pain She believes that she has bil carpal tunnel syndrome and R hip pain may be r/t to "years of cheerleading". 3) Migraine HA- She estimates 5-7 HAs per month She now treats with Naproxen 542m first and if pain remains will use Sumatriptan 1081m  Topiramate helped with HA reductio, however caused paresthesias. She is followed by Dr. JaMarvell [facility]- F/u PRN She denies CP/dyspnea/dizziness/palpitations She denies known exposure to COVID-19  Patient Care Team    Relationship Specialty Notifications Start End  DaEsaw GrandchildNP PCP - General Family Medicine  12/25/16   [facility]Lady [facility]    02/10/17   JaPieter PartridgeDOWinstonhysician Neurology  05/12/18     Patient Active Problem List   Diagnosis Date Noted  . BMI 36.0-36.9,adult 09/10/2017  . Elevated blood pressure reading without  diagnosis of hypertension 09/10/2017  . Migraine with status migrainosus, not intractable 01/07/2017  . Healthcare maintenance 01/07/2017     Past Medical History:  Diagnosis Date  . Hypertension   . Migraines   . Thyroid disease      Past Surgical History:  Procedure Laterality Date  . CESAREAN SECTION    . WISDOM TOOTH EXTRACTION       Family History  Problem Relation Age of Onset  . Depression Mother   . Diabetes Mother   . Hyperlipidemia Mother   . Hypertension Mother   . Obesity Mother   . Dementia Mother   . Renal Disease Mother   . Alcohol abuse Father   . Hyperlipidemia Maternal Grandmother   . Hypertension Maternal Grandmother   . Cancer Maternal Grandfather        lymphoma  . Heart attack Maternal Grandfather   . Depression Maternal Grandfather   . Hyperlipidemia Maternal Grandfather   . Hypertension Maternal Grandfather      Social History   Substance and Sexual Activity  Drug Use No     Social History   Substance and Sexual Activity  Alcohol Use No     Social History   Tobacco Use  Smoking Status Never Smoker  Smokeless Tobacco Never Used     Outpatient Encounter Medications as of 09/07/2018  Medication Sig  . B-D UF III MINI PEN NEEDLES 31G X 5 MM MISC PLEASE SPECIFY DIRECTIONS, REFILLS AND QUANTITY  . Collagen Hydrolysate, Bovine, POWD by Does not apply route. One tablespoon twice daily  . cyclobenzaprine (FLEXERIL) 10 MG  tablet TAKE 1 TABLET BY MOUTH EVERYDAY AT BEDTIME  . naproxen (NAPROSYN) 500 MG tablet Take 1 tablet (500 mg total) by mouth every 12 (twelve) hours as needed.  . SUMAtriptan (IMITREX) 100 MG tablet TAKE 1 TABLET BY MOUTH AT THE ONSET OF MIGRAINE, MAY REPEAT IN 2 HOURS IF NEEDED. MAX 2 TABS/24HRS  . [DISCONTINUED] naproxen (NAPROSYN) 500 MG tablet Take 500 mg by mouth as needed.  . [DISCONTINUED] amoxicillin-clavulanate (AUGMENTIN) 875-125 MG tablet Take 1 tablet by mouth 2 (two) times daily.  . [DISCONTINUED]  phentermine 37.5 MG capsule TAKE 1 CAPSULE BY MOUTH EVERY DAY IN THE MORNING  . [DISCONTINUED] SUMAtriptan (IMITREX) 100 MG tablet Take 1 tablet (100 mg total) by mouth once as needed for up to 1 dose for migraine. May repeat in 2 hours if headache persists or recurs.   No facility-administered encounter medications on file as of 09/07/2018.     Allergies: Beeswax and Codeine  Body mass index is 39 kg/m.  Blood pressure (!) 141/82, temperature 97.6 F (36.4 C), temperature source Oral, height 5' 5.5" (1.664 m), weight 238 lb (108 kg), last menstrual period 08/18/2017. Review of Systems: General:   Denies fever, chills, unexplained weight loss.  Optho/Auditory:   Denies visual changes, blurred vision/LOV Respiratory:   Denies SOB, DOE more than baseline levels.  Cardiovascular:   Denies chest pain, palpitations, new onset peripheral edema  Gastrointestinal:   Denies nausea, vomiting, diarrhea.  Genitourinary: Denies dysuria, freq/ urgency, flank pain or discharge from genitals.  Endocrine:     Denies hot or cold intolerance, polyuria, polydipsia. Musculoskeletal:   Denies unexplained myalgias, joint swelling, unexplained arthralgias, gait problems.  Skin:  Denies rash, suspicious lesions Neurological:     Denies dizziness, unexplained weakness, numbness  Psychiatric/Behavioral:   Denies mood changes, suicidal or homicidal ideations, hallucinations  Observations/Objective: No acute distress noted during video conference  Assessment and Plan: Continue all medications as directed. Remain well hydrate, continue healthy eating, daily walking. Schedule fasting lab appt ASAP 1. Healthcare maintenance   2. Weight gain   3. Fatigue, unspecified type   4. Migraine with status migrainosus, not intractable, unspecified migraine type   5. BMI 39.0-39.9,adult     Healthcare maintenance Assessment and Plan: Continue all medications as directed. Remain well hydrate, continue healthy  eating, daily walking. Schedule fasting lab appt ASAP  Follow Up Instructions: 3 month f/u   I discussed the assessment and treatment plan with the patient. The patient was provided an opportunity to ask questions and all were answered. The patient agreed with the plan and demonstrated an understanding of the instructions.   The patient was advised to call back or seek an in-person evaluation if the symptoms worsen or if the condition fails to improve as anticipated.  Migraine with status migrainosus, not intractable 3) Migraine HA- She estimates 5-7 HAs per month She now treats with Naproxen 542m first and if pain remains will use Sumatriptan 1067m  Topiramate helped with HA reductio, however caused paresthesias. She is followed by Dr. JaMarvell [facility]reurology- F/u PRN  BMI 39.0-39.9,adult  Gradual Weight gain since Nov, she estimates it to >46 lbs Current 238 Body mass index is 39 kg/m.  She estimates to drink between 110-150 oz water/day. She consumes a low CHO/lean protein diet. She walks 1.5-2 miles everyday. She reports her mother having T2D, HTN, and Obesity. She was briefly on Liraglutide for weight loss Fall 2019 She has never had fasting labs completed- advised that this needs to be  completed ASAP Weight gain may be related to Thyroid Diease, Vit D Def, Diabetes    FOLLOW-UP:  Return in about 3 months (around 12/08/2018) for Obesity, HTN. Follow Up Instructions: 3 month f/u   I discussed the assessment and treatment plan with the patient. The patient was provided an opportunity to ask questions and all were answered. The patient agreed with the plan and demonstrated an understanding of the instructions.   The patient was advised to call back or seek an in-person evaluation if the symptoms worsen or if the condition fails to improve as anticipated.  I provided 18 minutes of non-face-to-face time during this encounter.   Esaw Grandchild, NP

## 2018-09-07 NOTE — Assessment & Plan Note (Signed)
Assessment and Plan: Continue all medications as directed. Remain well hydrate, continue healthy eating, daily walking. Schedule fasting lab appt ASAP  Follow Up Instructions: 3 month f/u   I discussed the assessment and treatment plan with the patient. The patient was provided an opportunity to ask questions and all were answered. The patient agreed with the plan and demonstrated an understanding of the instructions.   The patient was advised to call back or seek an in-person evaluation if the symptoms worsen or if the condition fails to improve as anticipated.

## 2018-09-08 ENCOUNTER — Other Ambulatory Visit: Payer: BC Managed Care – PPO | Admitting: Adult Health

## 2018-09-08 DIAGNOSIS — Z0000 Encounter for general adult medical examination without abnormal findings: Secondary | ICD-10-CM

## 2018-09-08 DIAGNOSIS — R5383 Other fatigue: Secondary | ICD-10-CM

## 2018-09-08 DIAGNOSIS — R635 Abnormal weight gain: Secondary | ICD-10-CM

## 2018-09-08 NOTE — Progress Notes (Addendum)
During venipuncture performed by [employer], Tracey, pt became nausea, diaphoretic and felt as though she was going to pass out.  Pt was transferred to exam room with help of [employer] and myself, laid down on exam table with legs elevated.  BP was 161/81, HR 71, SpO2 of 97%.  Since pt had already had enough blood drawn for fasting labs, pt was provided with graham crackers and ginger ale.  This writer performed additional venipuncture after pt felt better to obtain HgbA1c and CBC while in the supine position.  Pt was transferred to a sitting position after several minutes.  Repeat BP was  176/91 while sitting.  Per [name], pt held in the office for 15 minutes and BP was rechecked.  BP 172/109, HR 79. SpO2 of 97%. [name], NP advised of BP readings.  Pt was then transferred to a lying position on her left side for 10 minutes and BP was repeated.  BP was 149/84 and HR of 76.  Pt was seen by [name] before pt was discharged.  (See her OV note).  Charyl Bigger, CMA

## 2018-09-09 ENCOUNTER — Encounter: Payer: BC Managed Care – PPO | Admitting: Adult Health

## 2018-09-09 ENCOUNTER — Other Ambulatory Visit: Payer: Self-pay | Admitting: Adult Health

## 2018-09-09 DIAGNOSIS — E559 Vitamin D deficiency, unspecified: Secondary | ICD-10-CM

## 2018-09-09 LAB — CBC WITH DIFFERENTIAL/PLATELET
Basophils Absolute: 0.1 10*3/uL (ref 0.0–0.2)
Basos: 1 %
EOS (ABSOLUTE): 0.2 10*3/uL (ref 0.0–0.4)
Eos: 3 %
Hematocrit: 40.5 % (ref 34.0–46.6)
Hemoglobin: 13.6 g/dL (ref 11.1–15.9)
Immature Grans (Abs): 0 10*3/uL (ref 0.0–0.1)
Immature Granulocytes: 0 %
Lymphocytes Absolute: 2.9 10*3/uL (ref 0.7–3.1)
Lymphs: 37 %
MCH: 28.5 pg (ref 26.6–33.0)
MCHC: 33.6 g/dL (ref 31.5–35.7)
MCV: 85 fL (ref 79–97)
Monocytes Absolute: 0.5 10*3/uL (ref 0.1–0.9)
Monocytes: 7 %
Neutrophils Absolute: 4.1 10*3/uL (ref 1.4–7.0)
Neutrophils: 52 %
Platelets: 300 10*3/uL (ref 150–450)
RBC: 4.77 x10E6/uL (ref 3.77–5.28)
RDW: 13.6 % (ref 11.7–15.4)
WBC: 7.7 10*3/uL (ref 3.4–10.8)

## 2018-09-09 LAB — COMPREHENSIVE METABOLIC PANEL
ALT: 33 IU/L — ABNORMAL HIGH (ref 0–32)
AST: 25 IU/L (ref 0–40)
Albumin/Globulin Ratio: 1.8 (ref 1.2–2.2)
Albumin: 4.8 g/dL (ref 3.8–4.8)
Alkaline Phosphatase: 78 IU/L (ref 39–117)
BUN/Creatinine Ratio: 16 (ref 9–23)
BUN: 12 mg/dL (ref 6–24)
Bilirubin Total: 0.5 mg/dL (ref 0.0–1.2)
CO2: 22 mmol/L (ref 20–29)
Calcium: 9.8 mg/dL (ref 8.7–10.2)
Chloride: 99 mmol/L (ref 96–106)
Creatinine, Ser: 0.73 mg/dL (ref 0.57–1.00)
GFR calc Af Amer: 113 mL/min/{1.73_m2} (ref >59)
GFR calc non Af Amer: 98 mL/min/{1.73_m2} (ref >59)
Globulin, Total: 2.6 g/dL (ref 1.5–4.5)
Glucose: 89 mg/dL (ref 65–99)
Potassium: 4.6 mmol/L (ref 3.5–5.2)
Sodium: 138 mmol/L (ref 134–144)
Total Protein: 7.4 g/dL (ref 6.0–8.5)

## 2018-09-09 LAB — TSH: TSH: 3.77 u[IU]/mL (ref 0.450–4.500)

## 2018-09-09 LAB — T3: T3, Total: 136 ng/dL (ref 71–180)

## 2018-09-09 LAB — LIPID PANEL
Chol/HDL Ratio: 2.7 ratio (ref 0.0–4.4)
Cholesterol, Total: 166 mg/dL (ref 100–199)
HDL: 62 mg/dL (ref >39)
LDL Calculated: 93 mg/dL (ref 0–99)
Triglycerides: 53 mg/dL (ref 0–149)
VLDL Cholesterol Cal: 11 mg/dL (ref 5–40)

## 2018-09-09 LAB — HEMOGLOBIN A1C
Est. average glucose Bld gHb Est-mCnc: 103 mg/dL
Hgb A1c MFr Bld: 5.2 % (ref 4.8–5.6)

## 2018-09-09 LAB — VITAMIN D 25 HYDROXY (VIT D DEFICIENCY, FRACTURES): Vit D, 25-Hydroxy: 27.2 ng/mL — ABNORMAL LOW (ref 30.0–100.0)

## 2018-09-09 LAB — T4, FREE: Free T4: 1.01 ng/dL (ref 0.82–1.77)

## 2018-09-09 MED ORDER — VITAMIN D (ERGOCALCIFEROL) 1.25 MG (50000 UNIT) PO CAPS: 50000.0000 [IU] | ORAL_CAPSULE | ORAL | 0 refills | Status: DC

## 2018-09-11 ENCOUNTER — Encounter: Payer: Self-pay | Admitting: Adult Health

## 2018-09-13 ENCOUNTER — Other Ambulatory Visit: Payer: Self-pay | Admitting: Adult Health

## 2018-09-13 MED ORDER — LISINOPRIL 10 MG PO TABS: 10.0000 mg | ORAL_TABLET | Freq: Every day | ORAL | 0 refills | Status: DC

## 2018-09-19 ENCOUNTER — Other Ambulatory Visit: Payer: Self-pay | Admitting: Neurology

## 2018-09-19 ENCOUNTER — Other Ambulatory Visit: Payer: Self-pay | Admitting: Adult Health

## 2018-09-19 DIAGNOSIS — G43009 Migraine without aura, not intractable, without status migrainosus: Secondary | ICD-10-CM

## 2018-09-20 ENCOUNTER — Other Ambulatory Visit: Payer: Self-pay

## 2018-09-20 MED ORDER — SUMATRIPTAN SUCCINATE 100 MG PO TABS: ORAL_TABLET | ORAL | 1 refills | Status: DC

## 2018-09-20 NOTE — Telephone Encounter (Signed)
Please have her request from Neuro I just bridged her last time  Thanks! Susan Barber

## 2018-09-20 NOTE — Telephone Encounter (Signed)
Pt sees neuro.  Should they be prescribing this medication?  You authorized last RX.  Charyl Bigger, CMA

## 2018-09-20 NOTE — Progress Notes (Signed)
Virtual Visit via Video Note  I connected with Susan Barber on 09/22/2018 at  9:30 AM EDT by a video enabled telemedicine application and verified that I am speaking with the correct person using two identifiers.  Location: Patient: Home Provider: In Clinic   I discussed the limitations of evaluation and management by telemedicine and the availability of in person appointments. The patient expressed understanding and agreed to proceed.  History of Present Illness: 09/07/2018 OV: Susan Barber is using WebEx today to discuss severall concerns- 1) Gradual Weight gain since Nov, she estimates it to >46 lbs Current 238 Body mass index is 39 kg/m.  She estimates to drink between 110-150 oz water/day. She consumes a low CHO/lean protein diet. She walks 1.5-2 miles everyday. She reports her mother having T2D, HTN, and Obesity. She was briefly on Liraglutide for weight loss Fall 2019 She has never had fasting labs completed- advised that this needs to be completed ASAP Weight gain may be related to Thyroid Diease, Vit D Def, Diabetes 2) Diffuse joint pain- Bil hands and R hip pain She believes that she has bil carpal tunnel syndrome and R hip pain may be r/t to "years of cheerleading". 3) Migraine HA- She estimates 5-7 HAs per month She now treats with Naproxen 540m first and if pain remains will use Sumatriptan 1060m  Topiramate helped with HA reductio, however caused paresthesias. She is followed by Dr. JaMarvell [facility]reurology- F/u PRN She denies CP/dyspnea/dizziness/palpitations She denies known exposure to COVID-19  09/22/2018: Susan Barber using WebEx today for HTN and lower ext edema. She denies CP/dyspnea/dizziness/chest pressure/palpitations. She has been following DASH diet, continuing to a walk daily, and drinks 100-150 oz water/day  Recent BP readings:     BP   Pulse  6/27 151/78  73  6/28 150/74  75  6/29 155/78  69  6/30 169/69  82  7/1     157/81  71  7/2    172/83  70  7/3    155/75  61  7/4    136/74  75  7/5    137/68  74  7/6    149/88  74  7/7    148/92  72  7/8    148/90  82    Patient Care Team    Relationship Specialty Notifications Start End  DaEsaw GrandchildNP PCP - General Family Medicine  12/25/16   GrLady GaryPhys[facility]    02/10/17   JaPieter PartridgeDO Consulting Physician Neurology  05/12/18     Patient Active Problem List   Diagnosis Date Noted  . BMI 39.0-39.9,adult 09/10/2017  . Elevated blood pressure reading without diagnosis of hypertension 09/10/2017  . Migraine with status migrainosus, not intractable 01/07/2017  . Healthcare maintenance 01/07/2017     Past Medical History:  Diagnosis Date  . Hypertension   . Migraines   . Thyroid disease      Past Surgical History:  Procedure Laterality Date  . CESAREAN SECTION    . WISDOM TOOTH EXTRACTION       Family History  Problem Relation Age of Onset  . Depression Mother   . Diabetes Mother   . Hyperlipidemia Mother   . Hypertension Mother   . Obesity Mother   . Dementia Mother   . Renal Disease Mother   . Alcohol abuse Father   . Hyperlipidemia Maternal Grandmother   . Hypertension Maternal Grandmother   . Cancer Maternal Grandfather  lymphoma  . Heart attack Maternal Grandfather   . Depression Maternal Grandfather   . Hyperlipidemia Maternal Grandfather   . Hypertension Maternal Grandfather      Social History   Substance and Sexual Activity  Drug Use No     Social History   Substance and Sexual Activity  Alcohol Use No     Social History   Tobacco Use  Smoking Status Never Smoker  Smokeless Tobacco Never Used     Outpatient Encounter Medications as of 09/22/2018  Medication Sig  . Coenzyme Q10 10 MG capsule Take 300 mg by mouth daily.  . Collagen Hydrolysate, Bovine, POWD by Does not apply route. One tablespoon twice daily  . cyclobenzaprine  (FLEXERIL) 10 MG tablet TAKE 1 TABLET BY MOUTH EVERYDAY AT BEDTIME (Patient taking differently: Take 10 mg by mouth at bedtime as needed. )  . naproxen (NAPROSYN) 500 MG tablet TAKE 1 TABLET (500 MG TOTAL) BY MOUTH EVERY 12 (TWELVE) HOURS AS NEEDED.  Marland Kitchen Riboflavin 400 MG CAPS Take 1 mg by mouth daily.  . SB MAGNESIUM CITRATE PO Take 400 mg by mouth daily.  . SUMAtriptan (IMITREX) 100 MG tablet May repeat in 2 hours if headache persists or recurs.  . Vitamin D, Ergocalciferol, (DRISDOL) 1.25 MG (50000 UT) CAPS capsule Take 1 capsule (50,000 Units total) by mouth every 7 (seven) days.  . [DISCONTINUED] lisinopril (ZESTRIL) 10 MG tablet Take 1 tablet (10 mg total) by mouth daily.  Marland Kitchen lisinopril-hydrochlorothiazide (ZESTORETIC) 10-12.5 MG tablet Take 1 tablet by mouth daily.  . [DISCONTINUED] B-D UF III MINI PEN NEEDLES 31G X 5 MM MISC PLEASE SPECIFY DIRECTIONS, REFILLS AND QUANTITY   No facility-administered encounter medications on file as of 09/22/2018.     Allergies: Beeswax and Codeine  Body mass index is 38.68 kg/m.  Blood pressure (!) 148/90, pulse 82, height 5' 5.5" (1.664 m), weight 236 lb (107 kg), last menstrual period 08/18/2017. Review of Systems: General:   Denies fever, chills, unexplained weight loss.  Optho/Auditory:   Denies visual changes, blurred vision/LOV Respiratory:   Denies SOB, DOE more than baseline levels.  Cardiovascular:   Denies chest pain, palpitations, new onset peripheral edema  Gastrointestinal:   Denies nausea, vomiting, diarrhea.  Genitourinary: Denies dysuria, freq/ urgency, flank pain or discharge from genitals.  Endocrine:     Denies hot or cold intolerance, polyuria, polydipsia. Musculoskeletal:   Denies unexplained myalgias, joint swelling, unexplained arthralgias, gait problems.  Skin:  Denies rash, suspicious lesions Neurological:     Denies dizziness, unexplained weakness, numbness  Psychiatric/Behavioral:   Denies mood changes, suicidal or  homicidal ideations, hallucinations This patient does not have sx concerning for COVID-19 Infection (ie; fever, chills, cough, new or worsening shortness of breath).  Observations/Objective: No acute distress noted during the video call  Assessment and Plan: Added diuretic to ACE Continue DASH diet, excellent water intake, regular exercise Continue to check BP/HR daily Call clinic if BP consistently <100/60 or >140/90 or if you experience any cardiac sx's   Follow Up Instructions: 3 weeks - call clinic with BP/HR readings   I discussed the assessment and treatment plan with the patient. The patient was provided an opportunity to ask questions and all were answered. The patient agreed with the plan and demonstrated an understanding of the instructions.   The patient was advised to call back or seek an in-person evaluation if the symptoms worsen or if the condition fails to improve as anticipated.  I provided 15 minutes of non-face-to-face  time during this encounter.   Esaw Grandchild, NP

## 2018-09-22 ENCOUNTER — Ambulatory Visit (INDEPENDENT_AMBULATORY_CARE_PROVIDER_SITE_OTHER): Payer: BC Managed Care – PPO | Admitting: Adult Health

## 2018-09-22 ENCOUNTER — Encounter: Payer: Self-pay | Admitting: Adult Health

## 2018-09-22 ENCOUNTER — Other Ambulatory Visit: Payer: Self-pay

## 2018-09-22 DIAGNOSIS — I10 Essential (primary) hypertension: Secondary | ICD-10-CM | POA: Diagnosis not present

## 2018-09-22 MED ORDER — LISINOPRIL-HYDROCHLOROTHIAZIDE 10-12.5 MG PO TABS: 1.0000 | ORAL_TABLET | Freq: Every day | ORAL | 0 refills | Status: DC

## 2018-09-22 NOTE — Assessment & Plan Note (Signed)
Assessment and Plan: Added diuretic to ACE Continue DASH diet, excellent water intake, regular exercise Continue to check BP/HR daily Call clinic if BP consistently <100/60 or >140/90 or if you experience any cardiac sx's   Follow Up Instructions: 3 weeks - call clinic with BP/HR readings   I discussed the assessment and treatment plan with the patient. The patient was provided an opportunity to ask questions and all were answered. The patient agreed with the plan and demonstrated an understanding of the instructions.   The patient was advised to call back or seek an in-person evaluation if the symptoms worsen or if the condition fails to improve as anticipated.

## 2018-10-25 ENCOUNTER — Encounter: Payer: Self-pay | Admitting: Adult Health

## 2018-10-25 ENCOUNTER — Other Ambulatory Visit: Payer: Self-pay

## 2018-10-25 MED ORDER — VITAMIN D (ERGOCALCIFEROL) 1.25 MG (50000 UNIT) PO CAPS: 50000.0000 [IU] | ORAL_CAPSULE | ORAL | 0 refills | Status: DC

## 2018-10-25 MED ORDER — LISINOPRIL-HYDROCHLOROTHIAZIDE 10-12.5 MG PO TABS: 1.0000 | ORAL_TABLET | Freq: Every day | ORAL | 0 refills | Status: DC

## 2018-10-25 NOTE — Telephone Encounter (Signed)
Which medications? Thanks? Valetta Fuller

## 2018-10-25 NOTE — Telephone Encounter (Signed)
Have her request Sumatriptan from Neurology

## 2018-10-25 NOTE — Telephone Encounter (Signed)
MyChart message sent to pt.  Charyl Bigger, CMA

## 2018-10-25 NOTE — Telephone Encounter (Signed)
Is it okay to send refills to the pharmacy for these medications?  I will advise pt that she will need to pay for these out of pocket since insurance will not cover early refills.  Charyl Bigger, CMA

## 2018-11-01 ENCOUNTER — Encounter: Payer: Self-pay | Admitting: Adult Health

## 2018-11-02 NOTE — Progress Notes (Signed)
Subjective:    Patient ID: Susan Barber, female    DOB: 1970/08/03, 48 y.o.   MRN: [id number]  HPI:  Susan Barber presents with RUQ that began 5 days ago. Pain is constant, rated 5/10-7/10-, described "naggin, burning". Pain is RUQ that will radiate to R flank. She denies acute accident/injury prior to onset of sx's. She reports nausea without vomiting. She reports sig family hx of cholecystitis- mother, siblings, maternal aunts and uncles She reports abdominal US 2016? None in system  She reports ambulatory BP readgins- SBP- 130-160, mean 150 DBP 60-90, mean 80 HR 60-90  She consumes >100 oz water/day She reports pain will worsen with high fatty meals She reports normal bowel habits, denies hematochezia/hematouria   Patient Care Team    Relationship Specialty Notifications Start End  [name] D, NP PCP - General Family Medicine  12/25/16   Lady Gary, [facility]    02/10/17   Pieter Partridge, DO Consulting Physician Neurology  05/12/18     Patient Active Problem List   Diagnosis Date Noted  . Abdominal pain, RUQ 11/03/2018  . BMI 39.0-39.9,adult 09/10/2017  . HTN, goal below 130/80 09/10/2017  . Migraine with status migrainosus, not intractable 01/07/2017  . Healthcare maintenance 01/07/2017     Past Medical History:  Diagnosis Date  . Hypertension   . Migraines   . Thyroid disease      Past Surgical History:  Procedure Laterality Date  . CESAREAN SECTION    . WISDOM TOOTH EXTRACTION       Family History  Problem Relation Age of Onset  . Depression Mother   . Diabetes Mother   . Hyperlipidemia Mother   . Hypertension Mother   . Obesity Mother   . Dementia Mother   . Renal Disease Mother   . Alcohol abuse Father   . Hyperlipidemia Maternal Grandmother   . Hypertension Maternal Grandmother   . Cancer Maternal Grandfather        lymphoma  . Heart attack Maternal Grandfather   . Depression Maternal Grandfather   .  Hyperlipidemia Maternal Grandfather   . Hypertension Maternal Grandfather      Social History   Substance and Sexual Activity  Drug Use No     Social History   Substance and Sexual Activity  Alcohol Use No     Social History   Tobacco Use  Smoking Status Never Smoker  Smokeless Tobacco Never Used     Outpatient Encounter Medications as of 11/03/2018  Medication Sig  . Coenzyme Q10 10 MG capsule Take 300 mg by mouth daily.  . Collagen Hydrolysate, Bovine, POWD by Does not apply route. One tablespoon twice daily  . cyclobenzaprine (FLEXERIL) 10 MG tablet Take 10 mg by mouth at bedtime as needed for muscle spasms.  . naproxen (NAPROSYN) 500 MG tablet TAKE 1 TABLET (500 MG TOTAL) BY MOUTH EVERY 12 (TWELVE) HOURS AS NEEDED.  Marland Kitchen Riboflavin 400 MG CAPS Take 1 mg by mouth daily.  . SB MAGNESIUM CITRATE PO Take 400 mg by mouth daily.  . SUMAtriptan (IMITREX) 100 MG tablet May repeat in 2 hours if headache persists or recurs.  . Vitamin D, Ergocalciferol, (DRISDOL) 1.25 MG (50000 UT) CAPS capsule Take 1 capsule (50,000 Units total) by mouth every 7 (seven) days.  . [DISCONTINUED] lisinopril-hydrochlorothiazide (ZESTORETIC) 10-12.5 MG tablet Take 1 tablet by mouth daily.  Marland Kitchen lisinopril-hydrochlorothiazide (ZESTORETIC) 20-12.5 MG tablet Take 1 tablet by mouth daily.  . ondansetron (ZOFRAN) 8 MG  tablet Take 1 tablet (8 mg total) by mouth every 8 (eight) hours as needed for nausea or vomiting.  . [DISCONTINUED] cyclobenzaprine (FLEXERIL) 10 MG tablet TAKE 1 TABLET BY MOUTH EVERYDAY AT BEDTIME (Patient taking differently: Take 10 mg by mouth at bedtime as needed. )   No facility-administered encounter medications on file as of 11/03/2018.     Allergies: Beeswax and Codeine  Body mass index is 40.23 kg/m.  Blood pressure (!) 155/84, pulse 80, temperature 98.9 F (37.2 C), temperature source Oral, height 5' 5.5" (1.664 m), weight 245 lb 8 oz (111.4 kg), last menstrual period  08/18/2017, SpO2 100 %.   Review of Systems  Constitutional: Positive for activity change, appetite change and fatigue. Negative for chills, diaphoresis, fever and unexpected weight change.  Eyes: Negative for visual disturbance.  Respiratory: Negative for cough, chest tightness, shortness of breath, wheezing and stridor.   Cardiovascular: Negative for chest pain, palpitations and leg swelling.  Gastrointestinal: Positive for abdominal distention, abdominal pain and nausea. Negative for blood in stool, constipation, diarrhea and vomiting.  Endocrine: Negative for cold intolerance, heat intolerance, polydipsia, polyphagia and polyuria.  Genitourinary: Positive for flank pain. Negative for difficulty urinating, dysuria and hematuria.  Skin: Negative for color change, pallor, rash and wound.  Neurological: Negative for dizziness and headaches.  Hematological: Negative for adenopathy. Does not bruise/bleed easily.       Objective:   Physical Exam Vitals signs and nursing note reviewed.  Constitutional:      General: She is not in acute distress.    Appearance: She is obese. She is not ill-appearing, toxic-appearing or diaphoretic.  HENT:     Head: Normocephalic and atraumatic.  Cardiovascular:     Rate and Rhythm: Normal rate and regular rhythm.     Heart sounds: Normal heart sounds. No murmur. No friction rub. No gallop.   Pulmonary:     Effort: Pulmonary effort is normal. No respiratory distress.     Breath sounds: Normal breath sounds. No stridor. No wheezing, rhonchi or rales.  Chest:     Chest wall: No tenderness.  Abdominal:     General: Abdomen is protuberant. Bowel sounds are normal. There is distension.     Palpations: Abdomen is soft.     Tenderness: There is abdominal tenderness in the right upper quadrant. There is no right CVA tenderness, left CVA tenderness, guarding or rebound. Negative signs include Murphy's sign, Rovsing's sign, McBurney's sign, psoas sign and  obturator sign.     Hernia: No hernia is present.       Comments: RUQ- slight distension   Skin:    General: Skin is warm and dry.     Capillary Refill: Capillary refill takes less than 2 seconds.  Neurological:     Mental Status: She is alert and oriented to person, place, and time.  Psychiatric:        Mood and Affect: Mood normal. Mood is not anxious or depressed.        Behavior: Behavior normal.        Assessment & Plan:   1. RUQ pain   2. Abdominal pain, RUQ   3. HTN, goal below 130/80   4. Healthcare maintenance     Abdominal pain, RUQ Urinalysis-Normal Remain well hydrated, follow "Gallbladder Eating Plan". Ultrasound ordered- someone will call you to schedule an imaging appt. We will call you when imaging and lab results are available. Please use Ondansetron as needed for nausea/vomting. Continue to social distance and wear  a mask when in public.  HTN, goal below 130/80 She reports ambulatory BP readgins- SBP- 130-160, mean 150 DBP 60-90, mean 80 HR 60-90 Lisinopril/HCTZ increased to 20/12.36m- continue to check BP/HR daily-call clinic in two weeks with readings.   Healthcare maintenance Lisinopril/HCTZ increased to 20/12.562m continue to check BP/HR daily-call clinic in two weeks with readings. Continue to social distance and wear a mask when in public.    FOLLOW-UP:  Return in 2 weeks (on 11/17/2018) for Evaluate Medication Effectiveness.

## 2018-11-03 ENCOUNTER — Other Ambulatory Visit: Payer: Self-pay

## 2018-11-03 ENCOUNTER — Ambulatory Visit (INDEPENDENT_AMBULATORY_CARE_PROVIDER_SITE_OTHER): Payer: BC Managed Care – PPO | Admitting: Adult Health

## 2018-11-03 ENCOUNTER — Encounter: Payer: Self-pay | Admitting: Adult Health

## 2018-11-03 VITALS — BP 155/84 | HR 80 | Temp 98.9°F | Ht 65.5 in | Wt 245.5 lb

## 2018-11-03 DIAGNOSIS — R1011 Right upper quadrant pain: Secondary | ICD-10-CM | POA: Diagnosis not present

## 2018-11-03 DIAGNOSIS — Z0000 Encounter for general adult medical examination without abnormal findings: Secondary | ICD-10-CM | POA: Diagnosis not present

## 2018-11-03 DIAGNOSIS — I10 Essential (primary) hypertension: Secondary | ICD-10-CM

## 2018-11-03 LAB — POCT URINALYSIS DIPSTICK
Bilirubin, UA: NEGATIVE
Blood, UA: NEGATIVE
Glucose, UA: NEGATIVE
Ketones, UA: NEGATIVE
Leukocytes, UA: NEGATIVE
Nitrite, UA: NEGATIVE
Protein, UA: NEGATIVE
Spec Grav, UA: 1.01 (ref 1.010–1.025)
Urobilinogen, UA: 0.2 E.U./dL
pH, UA: 7 (ref 5.0–8.0)

## 2018-11-03 MED ORDER — LISINOPRIL-HYDROCHLOROTHIAZIDE 20-12.5 MG PO TABS: 1.0000 | ORAL_TABLET | Freq: Every day | ORAL | 3 refills | Status: DC

## 2018-11-03 MED ORDER — ONDANSETRON HCL 8 MG PO TABS: 8.0000 mg | ORAL_TABLET | Freq: Three times a day (TID) | ORAL | 0 refills | Status: DC | PRN

## 2018-11-03 NOTE — Patient Instructions (Addendum)
Abdominal Pain, Adult Abdominal pain can be caused by many things. Often, abdominal pain is not serious and it gets better with no treatment or by being treated at home. However, sometimes abdominal pain is serious. Your health care provider will do a medical history and a physical exam to try to determine the cause of your abdominal pain. Follow these instructions at home:  Take over-the-counter and prescription medicines only as told by your health care provider. Do not take a laxative unless told by your health care provider.  Drink enough fluid to keep your urine clear or pale yellow.  Watch your condition for any changes.  Keep all follow-up visits as told by your health care provider. This is important. Contact a health care provider if:  Your abdominal pain changes or gets worse.  You are not hungry or you lose weight without trying.  You are constipated or have diarrhea for more than 2-3 days.  You have pain when you urinate or have a bowel movement.  Your abdominal pain wakes you up at night.  Your pain gets worse with meals, after eating, or with certain foods.  You are throwing up and cannot keep anything down.  You have a fever. Get help right away if:  Your pain does not go away as soon as your health care provider told you to expect.  You cannot stop throwing up.  Your pain is only in areas of the abdomen, such as the right side or the left lower portion of the abdomen.  You have bloody or black stools, or stools that look like tar.  You have severe pain, cramping, or bloating in your abdomen.  You have signs of dehydration, such as: ? Dark urine, very little urine, or no urine. ? Cracked lips. ? Dry mouth. ? Sunken eyes. ? Sleepiness. ? Weakness. This information is not intended to replace advice given to you by your health care provider. Make sure you discuss any questions you have with your health care provider. Document Released: 12/11/2004 Document  Revised: 09/21/2015 Document Reviewed: 08/15/2015 Elsevier Interactive Patient Education  2020 [employer].   [place] If you have a gallbladder condition, you may have trouble digesting fats. Eating a low-fat diet can help reduce your symptoms, and may be helpful before and after having surgery to remove your gallbladder (cholecystectomy). Your health care provider may recommend that you work with a diet and nutrition specialist (dietitian) to help you reduce the amount of fat in your diet. What are tips for following this plan? General guidelines  Limit your fat intake to less than 30% of your total daily calories. If you eat around 1,800 calories each day, this is less than 60 grams (g) of fat per day.  Fat is an important part of a healthy diet. Eating a low-fat diet can make it hard to maintain a healthy body weight. Ask your dietitian how much fat, calories, and other nutrients you need each day.  Eat small, frequent meals throughout the day instead of three large meals.  Drink at least 8-10 cups of fluid a day. Drink enough fluid to keep your urine clear or pale yellow.  Limit alcohol intake to no more than 1 drink a day for nonpregnant women and 2 drinks a day for men. One drink equals 12 oz of beer, 5 oz of wine, or 1 oz of hard liquor. Reading food labels  Check Nutrition Facts on food labels for the amount of fat per serving.  Choose foods with less than 3 grams of fat per serving. Shopping  Choose nonfat and low-fat healthy foods. Look for the words "nonfat," "low fat," or "fat free."  Avoid buying processed or prepackaged foods. Cooking  Cook using low-fat methods, such as baking, broiling, grilling, or boiling.  Cook with small amounts of healthy fats, such as olive oil, grapeseed oil, canola oil, or sunflower oil. What foods are recommended?   All fresh, frozen, or canned fruits and vegetables.  Whole grains.  Low-fat or non-fat (skim) milk and  yogurt.  Lean meat, skinless poultry, fish, eggs, and beans.  Low-fat protein supplement powders or drinks.  Spices and herbs. What foods are not recommended?  High-fat foods. These include baked goods, fast food, fatty cuts of meat, ice cream, french toast, sweet rolls, pizza, cheese bread, foods covered with butter, creamy sauces, or cheese.  Fried foods. These include french fries, tempura, battered fish, breaded chicken, fried breads, and sweets.  Foods with strong odors.  Foods that cause bloating and gas. Summary  A low-fat diet can be helpful if you have a gallbladder condition, or before and after gallbladder surgery.  Limit your fat intake to less than 30% of your total daily calories. This is about 60 g of fat if you eat 1,800 calories each day.  Eat small, frequent meals throughout the day instead of three large meals. This information is not intended to replace advice given to you by your health care provider. Make sure you discuss any questions you have with your health care provider. Document Released: 03/08/2013 Document Revised: 06/24/2018 Document Reviewed: 04/10/2016 Elsevier Patient Education  2020 [place] well hydrated, follow "Gallbladder Eating Plan". Ultrasound ordered- someone will call you to schedule an imaging appt. We will call you when imaging and lab results are available. Lisinopril/HCTZ increased to 20/12.62m- continue to check BP/HR daily-call clinic in two weeks with readings. Please use Ondansetron as needed for nausea/vomting. Continue to social distance and wear a mask when in public. FEEL BETTER!

## 2018-11-03 NOTE — Assessment & Plan Note (Addendum)
She reports ambulatory BP readgins- SBP- 130-160, mean 150 DBP 60-90, mean 80 HR 60-90 Lisinopril/HCTZ increased to 20/12.65m- continue to check BP/HR daily-call clinic in two weeks with readings.

## 2018-11-03 NOTE — Assessment & Plan Note (Signed)
Lisinopril/HCTZ increased to 20/12.70m- continue to check BP/HR daily-call clinic in two weeks with readings. Continue to social distance and wear a mask when in public.

## 2018-11-03 NOTE — Assessment & Plan Note (Addendum)
Urinalysis-Normal Remain well hydrated, follow "Gallbladder Eating Plan". Ultrasound ordered- someone will call you to schedule an imaging appt. We will call you when imaging and lab results are available. Please use Ondansetron as needed for nausea/vomting. Continue to social distance and wear a mask when in public.

## 2018-11-04 ENCOUNTER — Telehealth: Payer: Self-pay | Admitting: Adult Health

## 2018-11-04 NOTE — Telephone Encounter (Signed)
Raven @ [facility] request call back @ [phone] x [phone] to clarify extra blood collection included for patient.   --Forwarding message to medical assistant.  --Dion Body

## 2018-11-04 NOTE — Telephone Encounter (Signed)
Advised Raven that no additional tests are needed.  Charyl Bigger, CMA

## 2018-11-04 NOTE — Telephone Encounter (Signed)
LVM for Raven to return call.  Charyl Bigger, CMA

## 2018-11-05 ENCOUNTER — Other Ambulatory Visit: Payer: Self-pay | Admitting: Adult Health

## 2018-11-05 LAB — COMPREHENSIVE METABOLIC PANEL
ALT: 21 IU/L (ref 0–32)
AST: 15 IU/L (ref 0–40)
Albumin/Globulin Ratio: 1.5 (ref 1.2–2.2)
Albumin: 4.8 g/dL (ref 3.8–4.8)
Alkaline Phosphatase: 87 IU/L (ref 39–117)
BUN/Creatinine Ratio: 23 (ref 9–23)
BUN: 18 mg/dL (ref 6–24)
Bilirubin Total: 0.5 mg/dL (ref 0.0–1.2)
CO2: 24 mmol/L (ref 20–29)
Calcium: 9.7 mg/dL (ref 8.7–10.2)
Chloride: 97 mmol/L (ref 96–106)
Creatinine, Ser: 0.78 mg/dL (ref 0.57–1.00)
GFR calc Af Amer: 105 mL/min/{1.73_m2} (ref >59)
GFR calc non Af Amer: 91 mL/min/{1.73_m2} (ref >59)
Globulin, Total: 3.2 g/dL (ref 1.5–4.5)
Glucose: 92 mg/dL (ref 65–99)
Potassium: 4.1 mmol/L (ref 3.5–5.2)
Sodium: 138 mmol/L (ref 134–144)
Total Protein: 8 g/dL (ref 6.0–8.5)

## 2018-11-05 LAB — CBC WITH DIFFERENTIAL/PLATELET
Basophils Absolute: 0.1 10*3/uL (ref 0.0–0.2)
Basos: 1 %
EOS (ABSOLUTE): 0.1 10*3/uL (ref 0.0–0.4)
Eos: 1 %
Hematocrit: 40.8 % (ref 34.0–46.6)
Hemoglobin: 13.7 g/dL (ref 11.1–15.9)
Immature Grans (Abs): 0.1 10*3/uL (ref 0.0–0.1)
Immature Granulocytes: 1 %
Lymphocytes Absolute: 3.3 10*3/uL — ABNORMAL HIGH (ref 0.7–3.1)
Lymphs: 24 %
MCH: 27.7 pg (ref 26.6–33.0)
MCHC: 33.6 g/dL (ref 31.5–35.7)
MCV: 82 fL (ref 79–97)
Monocytes Absolute: 0.9 10*3/uL (ref 0.1–0.9)
Monocytes: 7 %
Neutrophils Absolute: 9.4 10*3/uL — ABNORMAL HIGH (ref 1.4–7.0)
Neutrophils: 66 %
Platelets: 362 10*3/uL (ref 150–450)
RBC: 4.95 x10E6/uL (ref 3.77–5.28)
RDW: 13.4 % (ref 11.7–15.4)
WBC: 13.9 10*3/uL — ABNORMAL HIGH (ref 3.4–10.8)

## 2018-11-05 LAB — BILIRUBIN, DIRECT: Bilirubin, Direct: 0.14 mg/dL (ref 0.00–0.40)

## 2018-11-05 LAB — LACTATE DEHYDROGENASE: LDH: 242 IU/L — ABNORMAL HIGH (ref 119–226)

## 2018-11-05 LAB — GAMMA GT: GGT: 28 IU/L (ref 0–60)

## 2018-11-08 ENCOUNTER — Other Ambulatory Visit: Payer: Self-pay | Admitting: Neurology

## 2018-11-12 ENCOUNTER — Ambulatory Visit
Admission: RE | Admit: 2018-11-12 | Discharge: 2018-11-12 | Disposition: A | Discharge status: Home / Self Care | Payer: BC Managed Care – PPO | Source: Ambulatory Visit | Attending: Adult Health | Admitting: Adult Health

## 2018-11-12 ENCOUNTER — Encounter: Payer: Self-pay | Admitting: Adult Health

## 2018-11-12 DIAGNOSIS — R1011 Right upper quadrant pain: Secondary | ICD-10-CM

## 2018-11-15 ENCOUNTER — Other Ambulatory Visit: Payer: Self-pay | Admitting: Adult Health

## 2018-11-17 ENCOUNTER — Ambulatory Visit: Payer: BC Managed Care – PPO | Admitting: Adult Health

## 2018-12-08 ENCOUNTER — Encounter: Payer: Self-pay | Admitting: Adult Health

## 2018-12-13 ENCOUNTER — Other Ambulatory Visit: Payer: Self-pay | Admitting: Neurology

## 2019-01-25 ENCOUNTER — Other Ambulatory Visit: Payer: Self-pay | Admitting: Neurology

## 2019-02-04 ENCOUNTER — Other Ambulatory Visit: Payer: Self-pay | Admitting: Adult Health

## 2019-02-21 ENCOUNTER — Telehealth: Payer: BC Managed Care – PPO | Admitting: Physician Assistant

## 2019-02-21 DIAGNOSIS — J0110 Acute frontal sinusitis, unspecified: Secondary | ICD-10-CM | POA: Diagnosis not present

## 2019-02-21 MED ORDER — AMOXICILLIN-POT CLAVULANATE 875-125 MG PO TABS: 1.0000 | ORAL_TABLET | Freq: Two times a day (BID) | ORAL | 0 refills | Status: DC

## 2019-02-21 NOTE — Progress Notes (Signed)
We are sorry that you are not feeling well.  Here is how we plan to help!  Based on what you have shared with me it looks like you have sinusitis.  Sinusitis is inflammation and infection in the sinus cavities of the head.  Based on your presentation I believe you most likely have Acute Bacterial Sinusitis.  This is an infection caused by bacteria and is treated with antibiotics. I have prescribed Augmentin 831m/125mg one tablet twice daily with food, for 7 days. You may use an oral decongestant such as Mucinex D or if you have glaucoma or high blood pressure use plain Mucinex. Saline nasal spray help and can safely be used as often as needed for congestion.  If you develop worsening sinus pain, fever or notice severe headache and vision changes, or if symptoms are not better after completion of antibiotic, please schedule an appointment with a health care provider.    Sinus infections are not as easily transmitted as other respiratory infection, however we still recommend that you avoid close contact with loved ones, especially the very young and elderly.  Remember to wash your hands thoroughly throughout the day as this is the number one way to prevent the spread of infection!  Home Care:  Only take medications as instructed by your medical team.  Complete the entire course of an antibiotic.  Do not take these medications with alcohol.  A steam or ultrasonic humidifier can help congestion.  You can place a towel over your head and breathe in the steam from hot water coming from a faucet.  Avoid close contacts especially the very young and the elderly.  Cover your mouth when you cough or sneeze.  Always remember to wash your hands.  Get Help Right Away If:  You develop worsening fever or sinus pain.  You develop a severe head ache or visual changes.  Your symptoms persist after you have completed your treatment plan.  Make sure you  Understand these instructions.  Will watch your  condition.  Will get help right away if you are not doing well or get worse.  Your e-visit answers were reviewed by a board certified advanced clinical practitioner to complete your personal care plan.  Depending on the condition, your plan could have included both over the counter or prescription medications.  If there is a problem please reply  once you have received a response from your provider.  Your safety is important to [place]  If you have drug allergies check your prescription carefully.    You can use MyChart to ask questions about today's visit, request a non-urgent call back, or ask for a work or school excuse for 24 hours related to this e-Visit. If it has been greater than 24 hours you will need to follow up with your provider, or enter a new e-Visit to address those concerns.  You will get an e-mail in the next two days asking about your experience.  I hope that your e-visit has been valuable and will speed your recovery. Thank you for using e-visits.  6 minutes spent on this chart

## 2019-02-22 ENCOUNTER — Telehealth: Payer: Self-pay | Admitting: Neurology

## 2019-02-22 ENCOUNTER — Other Ambulatory Visit: Payer: Self-pay

## 2019-02-22 DIAGNOSIS — G43009 Migraine without aura, not intractable, without status migrainosus: Secondary | ICD-10-CM

## 2019-02-22 NOTE — Telephone Encounter (Signed)
Rx denied refills can be refilled at PCP office

## 2019-02-22 NOTE — Telephone Encounter (Signed)
When I saw her, she said her migraines were well-controlled.  As long as her migraines are stable, then there is no reason to follow up with me and her sumatriptan and naproxen could be managed by her PCP.

## 2019-02-22 NOTE — Telephone Encounter (Signed)
Requested Prescriptions   Pending Prescriptions Disp Refills  . SUMAtriptan (IMITREX) 100 MG tablet [Pharmacy Med Name: SUMATRIPTAN SUCC 100 MG TABLET] 10 tablet 0    Sig: TAKE 1 TABLET AT HEADACHE ONSET MAY REPEAT IN 2 HOURS IF HEADACHE PERSISTS OR RECURS.  . naproxen (NAPROSYN) 500 MG tablet [Pharmacy Med Name: NAPROXEN 500 MG TABLET] 20 tablet 2    Sig: TAKE 1 TABLET (500 MG TOTAL) BY MOUTH EVERY 12 (TWELVE) HOURS AS NEEDED.   Rx last filled:01/25/19 #10 0 refills 09/20/18 #20 2 refills  Pt last seen:05/12/18  Follow up appt scheduled:none  Are you handling his refills or back to PCP  Last visit 05/12/18 follow up as needed

## 2019-02-23 ENCOUNTER — Telehealth: Payer: Self-pay | Admitting: Neurology

## 2019-02-23 MED ORDER — SUMATRIPTAN SUCCINATE 100 MG PO TABS: ORAL_TABLET | ORAL | 0 refills | Status: DC

## 2019-02-23 NOTE — Progress Notes (Signed)
Virtual Visit via Video Note The purpose of this virtual visit is to provide medical care while limiting exposure to the novel coronavirus.    Consent was obtained for video visit:  Yes.   Answered questions that patient had about telehealth interaction:  Yes.   I discussed the limitations, risks, security and privacy concerns of performing an evaluation and management service by telemedicine. I also discussed with the patient that there may be a patient responsible charge related to this service. The patient expressed understanding and agreed to proceed.  Pt location: Home Physician Location: office Name of referring provider:  Esaw Grandchild, NP I connected with Susan Barber at patients initiation/request on 02/28/2019 at  8:30 AM EST by video enabled telemedicine application and verified that I am speaking with the correct person using two identifiers. Pt MRN:  [id number] Pt DOB:  12-17-1970 Video Participants:  Susan Barber   History of Present Illness:  Susan Barber is a 48 year old right-handed woman with hypertension and thyroid disease who follows up for migraines.  UPDATE: Migraines have been a little worse over past few months.  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   Intensity:  7/10 Duration:  Up to 2 hours; may last 3 hours sometimes if cannot take medication early enough.   Frequency:  10 migraines over past 30 days. Frequency of abortive medication: 10 days a month. Rescue protocol:  If mild, takes 2 naproxen.  If more severe, then sumatriptan.  If she wakes up with migraine, she takes sumatriptan with naproxen. Current NSAIDS:  Naproxen 567m  Current analgesics:  none Current triptans:  Sumatriptan 1073mCurrent ergotamine:  none Current anti-emetic:  Zofran 38m33murrent muscle relaxants:  Flexeril Current anti-anxiolytic:  none Current sleep aide:  none Current Antihypertensive  medications:  none Current Antidepressant medications:  none Current Anticonvulsant medications:  none Current anti-CGRP:  none Current Vitamins/Herbal/Supplements:  magnesium 400m45miboflavin 400mg438menzyme Q10 300mg,60murrent Antihistamines/Decongestants:  none Other therapy:  none Hormone/birth control:  none Other medications:  none  Caffeine:  3 to 5 cups of coffee daily Alcohol:  No Smoker:  No Diet:  Mediterranean diet. Drinks over 100 oz water daily Exercise:  Cardio and weight lifting 5 days a week Depression:  no; Anxiety:  no Other pain:  Low back pain Sleep hygiene:  Poor.  4-5 hours a night  HISTORY: Onset:  20s Location:  Unilateral (either side), rarely in back of head and neck Quality:  Throbbing if severe Initial intensity:  7/10.  She denies new headache, thunderclap headache or severe headache that wakes her from sleep. Aura:  no Premonitory Phase:  no Postdrome:  lethargy Associated symptoms:  Photophobia, phonophobia, vertigo, sometimes nausea.  She denies associated visual disturbance, autonomic symptoms or unilateral numbness or weakness. Initial duration:  If sumatriptan take early, then an hour; if wakes up with a migraine, it may take 2 hours Initial frequency:  Initially 10 to 12 days a month; since Dec 2019-Jan 2020, they occurred 3 or 4 days a month (she attributes this to diet, hydration and exercise) Initial frequency of abortive medication:3 or 4 times a month. Triggers:  Salt, emotional stress, menses Relieving factors:  None Activity:  aggravates  Past NSAIDS:  Advil Past analgesics:  Tylenol, Excedrin Past abortive triptans:  none Past abortive ergotamine:  none Past muscle relaxants:  none Past anti-emetic:  none Past antihypertensive medications:  none  Past antidepressant medications:  none Past anticonvulsant medications:  topiramate 134m (effective but caused paresthesias), zonisamide 1029mPast anti-CGRP:  none Past  vitamins/Herbal/Supplements:  none Past antihistamines/decongestants:  none Other past therapies:  Continuous hormone therapy  Family history of headache:  Grandmother, grandmother's sister, her 3 children  Past Medical History: Past Medical History:  Diagnosis Date  . Hypertension   . Migraines   . Thyroid disease     Medications: Outpatient Encounter Medications as of 02/28/2019  Medication Sig  . amoxicillin-clavulanate (AUGMENTIN) 875-125 MG tablet Take 1 tablet by mouth 2 (two) times daily.  . Coenzyme Q10 10 MG capsule Take 300 mg by mouth daily.  . Collagen Hydrolysate, Bovine, POWD by Does not apply route. One tablespoon twice daily  . cyclobenzaprine (FLEXERIL) 10 MG tablet Take 10 mg by mouth at bedtime as needed for muscle spasms.  . Marland Kitchenisinopril-hydrochlorothiazide (ZESTORETIC) 20-12.5 MG tablet Take 1 tablet by mouth daily.  . naproxen (NAPROSYN) 500 MG tablet TAKE 1 TABLET (500 MG TOTAL) BY MOUTH EVERY 12 (TWELVE) HOURS AS NEEDED.  . Marland Kitchenndansetron (ZOFRAN) 8 MG tablet Take 1 tablet (8 mg total) by mouth every 8 (eight) hours as needed for nausea or vomiting.  . Riboflavin 400 MG CAPS Take 1 mg by mouth daily.  . SB MAGNESIUM CITRATE PO Take 400 mg by mouth daily.  . SUMAtriptan (IMITREX) 100 MG tablet May repeat in 2 hours if headache persists or recurs.  . Vitamin D, Ergocalciferol, (DRISDOL) 1.25 MG (50000 UT) CAPS capsule Take 1 capsule (50,000 Units total) by mouth every 7 (seven) days. REPEAT LABS REQUIRED PRIOR TO ANY FURTHER REFILLS   No facility-administered encounter medications on file as of 02/28/2019.     Allergies: Allergies  Allergen Reactions  . Beeswax Other (See Comments)  . Codeine Nausea And Vomiting    Headaches.    Family History: Family History  Problem Relation Age of Onset  . Depression Mother   . Diabetes Mother   . Hyperlipidemia Mother   . Hypertension Mother   . Obesity Mother   . Dementia Mother   . Renal Disease Mother   .  Alcohol abuse Father   . Hyperlipidemia Maternal Grandmother   . Hypertension Maternal Grandmother   . Cancer Maternal Grandfather        lymphoma  . Heart attack Maternal Grandfather   . Depression Maternal Grandfather   . Hyperlipidemia Maternal Grandfather   . Hypertension Maternal Grandfather     Social History: Social History   Socioeconomic History  . Marital status: Married    Spouse name: LaMia Creek. Number of children: 3  . Years of education: Not on file  . Highest education level: Master's degree (e.g., MA, MS, MEng, MEd, MSW, MBA)  Occupational History  . 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 Financial resource strain: Not on file  . Food insecurity    Worry: Not on file    Inability: Not on file  . Transportation needs    Medical: Not on file    Non-medical: Not on file  Tobacco Use  . Smoking status: Never Smoker  . Smokeless tobacco: Never Used  Substance and Sexual Activity  . Alcohol use: No  . Drug use: No  . Sexual activity: Yes    Partners: Male    Birth control/protection: Surgical, Post-menopausal  Lifestyle  . Physical activity    Days per week: Not on file    Minutes per  session: Not on file  . Stress: Not on file  Relationships  . Social Herbalist on phone: Not on file    Gets together: Not on file    Attends religious service: Not on file    Active member of club or organization: Not on file    Attends meetings of clubs or organizations: Not on file    Relationship status: Not on file  . Intimate partner violence    Fear of current or ex partner: Not on file    Emotionally abused: Not on file    Physically abused: Not on file    Forced sexual activity: Not on file  Other Topics Concern  . Not on file  Social History Narrative   Patient is right-handed. She lives with her husband in a one level home. She drinks 3-5 large cups of coffee a day. She states she has not been exercising  lately.    Observations/Objective:   Height 5' 5"  (1.651 m), weight 247 lb (112 kg), last menstrual period 08/18/2017. No acute distress.  Alert and oriented.  Speech fluent and not dysarthric.  Language intact.  Eyes orthophoric on primary gaze.  Face symmetric.  Assessment and Plan:   Migraine without aura, without status migrainosus, not intractable, increased frequency likely secondary to work-related stress.  However, since they have been frequent for several months, will start a preventative.  1.  For preventative management, nortriptyline 14m at bedtime.  We can increase to 26mat bedtime in 4 weeks if needed. 2.  For abortive therapy, sumatriptan, or sumatriptan with naproxen if she wakes up with migraine. 3.  Limit use of pain relievers to no more than 2 days out of week to prevent risk of rebound or medication-overuse headache. 4.  Keep headache diary 5.  Exercise, hydration, caffeine cessation, sleep hygiene, monitor for and avoid triggers 6.  Magnesium citrate 40074maily, riboflavin 400m75mily, and coenzyme Q10 100mg75mee times daily 7. Follow up 3 to 4 months   Follow Up Instructions:    -I discussed the assessment and treatment plan with the patient. The patient was provided an opportunity to ask questions and all were answered. The patient agreed with the plan and demonstrated an understanding of the instructions.   The patient was advised to call back or seek an in-person evaluation if the symptoms worsen or if the condition fails to improve as anticipated.   Adam Dudley Major

## 2019-02-23 NOTE — Telephone Encounter (Signed)
Patient left msg with front that he is needing a prescription refill. That is all the info that was left. Thanks!

## 2019-02-23 NOTE — Telephone Encounter (Signed)
Pt has appt on 02/28/19 ok to end in enough until appt?

## 2019-02-23 NOTE — Telephone Encounter (Signed)
Yes

## 2019-02-23 NOTE — Telephone Encounter (Signed)
Noted Rx sent to pharmacy

## 2019-02-23 NOTE — Telephone Encounter (Signed)
Called patient and scheduled a virtual visit and she is needing a refill for her Imitrex called into [facility] in [place]. She said she only has 1 left. Please Call. Thanks

## 2019-02-28 ENCOUNTER — Encounter: Payer: Self-pay | Admitting: Neurology

## 2019-02-28 ENCOUNTER — Other Ambulatory Visit: Payer: Self-pay

## 2019-02-28 ENCOUNTER — Telehealth (INDEPENDENT_AMBULATORY_CARE_PROVIDER_SITE_OTHER): Payer: BC Managed Care – PPO | Admitting: Neurology

## 2019-02-28 DIAGNOSIS — G43009 Migraine without aura, not intractable, without status migrainosus: Secondary | ICD-10-CM | POA: Diagnosis not present

## 2019-02-28 DIAGNOSIS — Z791 Long term (current) use of non-steroidal anti-inflammatories (NSAID): Secondary | ICD-10-CM | POA: Diagnosis not present

## 2019-02-28 MED ORDER — SUMATRIPTAN SUCCINATE 100 MG PO TABS: ORAL_TABLET | ORAL | 0 refills | Status: DC

## 2019-02-28 MED ORDER — NORTRIPTYLINE HCL 10 MG PO CAPS: 10.0000 mg | ORAL_CAPSULE | Freq: Every day | ORAL | 3 refills | Status: DC

## 2019-02-28 MED ORDER — NAPROXEN 500 MG PO TABS: 500.0000 mg | ORAL_TABLET | Freq: Two times a day (BID) | ORAL | 2 refills | Status: DC | PRN

## 2019-02-28 NOTE — Patient Instructions (Signed)
1.  Start nortriptyline 39m at bedtime.  We can increase to 238mat bedtime in 4 weeks if needed. 2.  Limit use of pain relievers to no more than 2 days out of week to prevent risk of rebound or medication-overuse headache.

## 2019-03-04 ENCOUNTER — Other Ambulatory Visit: Payer: Self-pay | Admitting: Adult Health

## 2019-03-22 ENCOUNTER — Other Ambulatory Visit: Payer: Self-pay | Admitting: Adult Health

## 2019-03-22 ENCOUNTER — Encounter: Payer: Self-pay | Admitting: Adult Health

## 2019-03-22 ENCOUNTER — Other Ambulatory Visit: Payer: Self-pay | Admitting: Neurology

## 2019-03-25 ENCOUNTER — Other Ambulatory Visit: Payer: Self-pay

## 2019-03-25 MED ORDER — AIMOVIG 70 MG/ML ~~LOC~~ SOAJ: 70.0000 mg | SUBCUTANEOUS | 11 refills | Status: DC

## 2019-03-30 ENCOUNTER — Telehealth: Payer: BC Managed Care – PPO | Admitting: Nurse Practitioner

## 2019-03-30 DIAGNOSIS — J069 Acute upper respiratory infection, unspecified: Secondary | ICD-10-CM | POA: Diagnosis not present

## 2019-03-30 MED ORDER — BENZONATATE 100 MG PO CAPS: 100.0000 mg | ORAL_CAPSULE | Freq: Three times a day (TID) | ORAL | 0 refills | Status: DC | PRN

## 2019-03-30 MED ORDER — FLUTICASONE PROPIONATE 50 MCG/ACT NA SUSP: 2.0000 | Freq: Every day | NASAL | 6 refills | Status: DC

## 2019-03-30 NOTE — Progress Notes (Signed)
We are sorry you are not feeling well.  Here is how we plan to help!  Based on what you have shared with me, it looks like you may have a viral upper respiratory infection.  Upper respiratory infections are caused by a large number of viruses; however, rhinovirus is the most common cause.   Symptoms vary from person to person, with common symptoms including sore throat, cough, fatigue or lack of energy and feeling of general discomfort.  A low-grade fever of up to 100.4 may present, but is often uncommon.  Symptoms vary however, and are closely related to a person's age or underlying illnesses.  The most common symptoms associated with an upper respiratory infection are nasal discharge or congestion, cough, sneezing, headache and pressure in the ears and face.  These symptoms usually persist for about 3 to 10 days, but can last up to 2 weeks.  It is important to know that upper respiratory infections do not cause serious illness or complications in most cases.    Upper respiratory infections can be transmitted from person to person, with the most common method of transmission being a person's hands.  The virus is able to live on the skin and can infect other persons for up to 2 hours after direct contact.  Also, these can be transmitted when someone coughs or sneezes; thus, it is important to cover the mouth to reduce this risk.  To keep the spread of the illness at [place], good hand hygiene is very important.  This is an infection that is most likely caused by a virus. There are no specific treatments other than to help you with the symptoms until the infection runs its course.  We are sorry you are not feeling well.  Here is how we plan to help!   For nasal congestion, you may use an oral decongestants such as Mucinex D or if you have glaucoma or high blood pressure use plain Mucinex.  Saline nasal spray or nasal drops can help and can safely be used as often as needed for congestion.  For your congestion,  I have prescribed Fluticasone nasal spray one spray in each nostril twice a day  If you do not have a history of heart disease, hypertension, diabetes or thyroid disease, prostate/bladder issues or glaucoma, you may also use Sudafed to treat nasal congestion.  It is highly recommended that you consult with a pharmacist or your primary care physician to ensure this medication is safe for you to take.     If you have a cough, you may use cough suppressants such as Delsym and Robitussin.  If you have glaucoma or high blood pressure, you can also use Coricidin HBP.   For cough I have prescribed for you A prescription cough medication called Tessalon Perles 100 mg. You may take 1-2 capsules every 8 hours as needed for cough  If you have a sore or scratchy throat, use a saltwater gargle-  to  teaspoon of salt dissolved in a 4-ounce to 8-ounce glass of warm water.  Gargle the solution for approximately 15-30 seconds and then spit.  It is important not to swallow the solution.  You can also use throat lozenges/cough drops and Chloraseptic spray to help with throat pain or discomfort.  Warm or cold liquids can also be helpful in relieving throat pain.  For headache, pain or general discomfort, you can use Ibuprofen or Tylenol as directed.   Some authorities believe that zinc sprays or the use of  Echinacea may shorten the course of your symptoms.  * if you develop a fever and /or body aches, you may want to consider covid tetsing.  Testing Information: The COVID-19 Community Testing sites will begin testing BY APPOINTMENT ONLY.  You can schedule online at [email or web address]  If you do not have access to a smart phone or computer you may call [phone] for an appointment.  Testing Locations: Appointment schedule is 8 am to 3:30 pm at all sites  [facility] indoors at [address] [place] [address] [facility]  indoors at [place]. [address] [place], [place] [address] [place] indoors at [address] [place] [address]  Additional testing sites in the Community:  . For [facility] Testing sites in [facility]  FaceUpdate.uy  . For Pop-up testing sites in [place]  BowlDirectory.co.uk  . For Testing sites with regular hours https://onsms.org/[place]/  . For [place] RenewablesAnalytics.si  . For [facility] BasicJet.[email or web address]  . For [facility] testing in [place] and [employer] BasicJet.ca  . For [facility] testing in [facility]   https://lhi.care/covidtesting  For  more information about community testing call [phone]   HOME CARE . Only take medications as instructed by your medical team. . Be sure to drink plenty of fluids. Water is fine as well as fruit juices, sodas and electrolyte beverages. You may want to stay away from caffeine or alcohol. If you are nauseated, try taking small sips of liquids. How do you know if you are getting enough fluid? Your urine should be a pale yellow or almost colorless. . Get rest. . Taking a steamy shower or using a humidifier may help nasal congestion and ease sore throat pain. You can place a towel over your head and breathe in the steam from hot water coming from a faucet. . Using a saline nasal spray works much the same way. . Cough drops, hard candies and sore throat lozenges may ease your cough. . Avoid close contacts especially the very young and the elderly . Cover your mouth if you cough or sneeze . Always remember to wash your hands.   GET HELP RIGHT AWAY IF: . You develop worsening fever. . If  your symptoms do not improve within 10 days . You develop yellow or green discharge from your nose over 3 days. . You have coughing fits . You develop a severe head ache or visual changes. . You develop shortness of breath, difficulty breathing or start having chest pain . Your symptoms persist after you have completed your treatment plan  MAKE SURE YOU   Understand these instructions.  Will watch your condition.  Will get help right away if you are not doing well or get worse.  Your e-visit answers were reviewed by a board certified advanced clinical practitioner to complete your personal care plan. Depending upon the condition, your plan could have included both over the counter or prescription medications. Please review your pharmacy choice. If there is a problem, you may call our nursing hot line at and have the prescription routed to another pharmacy. Your safety is important to [place]. If you have drug allergies check your prescription carefully.   You can use MyChart to ask questions about today's visit, request a non-urgent call back, or ask for a work or school excuse for 24 hours related to this e-Visit. If it has been greater than 24 hours you will need to follow up with your  provider, or enter a new e-Visit to address those concerns. You will get an e-mail in the next two days asking about your experience.  I hope that your e-visit has been valuable and will speed your recovery. Thank you for using e-visits.   5-10 minutes spent reviewing and documenting in chart.

## 2019-04-01 ENCOUNTER — Other Ambulatory Visit: Payer: Self-pay | Admitting: Neurology

## 2019-05-08 ENCOUNTER — Other Ambulatory Visit: Payer: Self-pay | Admitting: Neurology

## 2019-05-14 ENCOUNTER — Ambulatory Visit: Payer: BC Managed Care – PPO | Attending: Internal Medicine

## 2019-05-14 DIAGNOSIS — Z23 Encounter for immunization: Secondary | ICD-10-CM | POA: Insufficient documentation

## 2019-05-14 NOTE — Progress Notes (Signed)
   Covid-19 Vaccination Clinic  Name:  Susan Barber    MRN: [id number] DOB: Jul 23, 1970  05/14/2019  [name] was observed post Covid-19 immunization for 15 minutes without incidence. She was provided with Vaccine Information Sheet and instruction to access the V-Safe system.   [name] was instructed to call 911 with any severe reactions post vaccine: Marland Kitchen Difficulty breathing  . Swelling of your face and throat  . A fast heartbeat  . A bad rash all over your body  . Dizziness and weakness    Immunizations Administered    Name Date Dose VIS Date Route   Pfizer COVID-19 Vaccine 05/14/2019  4:39 PM 0.3 mL 02/25/2019 Intramuscular   Manufacturer: [place]   Lot: [id number]   [place]: [id number]

## 2019-06-04 ENCOUNTER — Ambulatory Visit: Payer: BC Managed Care – PPO | Attending: Internal Medicine

## 2019-06-04 DIAGNOSIS — Z23 Encounter for immunization: Secondary | ICD-10-CM

## 2019-06-04 NOTE — Progress Notes (Signed)
   Covid-19 Vaccination Clinic  Name:  MANIYA DONOVAN    MRN: [id number] DOB: October 12, 1970  06/04/2019  Ms. Nantz was observed post Covid-19 immunization for 15 minutes without incident. She was provided with Vaccine Information Sheet and instruction to access the V-Safe system.   Ms. Schier was instructed to call 911 with any severe reactions post vaccine: Marland Kitchen Difficulty breathing  . Swelling of face and throat  . A fast heartbeat  . A bad rash all over body  . Dizziness and weakness   Immunizations Administered    Name Date Dose VIS Date Route   Pfizer COVID-19 Vaccine 06/04/2019  4:39 PM 0.3 mL 02/25/2019 Intramuscular   Manufacturer: [place]   Lot: [id number]   [place]: [id number]

## 2019-06-16 ENCOUNTER — Other Ambulatory Visit: Payer: Self-pay | Admitting: Neurology

## 2019-06-20 NOTE — Progress Notes (Signed)
Virtual Visit via Video Note The purpose of this virtual visit is to provide medical care while limiting exposure to the novel coronavirus.    Consent was obtained for video visit:  Yes.   Answered questions that patient had about telehealth interaction:  Yes.   I discussed the limitations, risks, security and privacy concerns of performing an evaluation and management service by telemedicine. I also discussed with the patient that there may be a patient responsible charge related to this service. The patient expressed understanding and agreed to proceed.  Pt location: Home Physician Location: office Name of referring provider:  Esaw Grandchild, NP I connected with Susan Barber at patients initiation/request on 06/21/2019 at  8:30 AM EDT by video enabled telemedicine application and verified that I am speaking with the correct person using two identifiers. Pt MRN:  [id number] Pt DOB:  1970/05/20 Video Participants:  Susan Barber   History of Present Illness:  Susan Barber is a 49 year old right-handed woman with hypertension and thyroid disease who follows up for migraines.  UPDATE: Started nortriptyline in December but it was discontinued the following month due to concerns that it contributed to elevated blood pressure.  Blood pressure has improved.  Started Aimovig instead.  Headaches are improved. Intensity:  7/10 Duration:  Up to 2 hours Frequency:  5 or 6 at most (3 occur the last week prior to next injection) Frequency of abortive medication: 10 days a month. Rescue protocol: sumatriptan; sumatriptan with naproxen if wakes up with headache. Current NSAIDS:Naproxen 521m Current analgesics:none Current triptans:Sumatriptan 1072mCurrent ergotamine:none Current anti-emetic:Zofran 35m40murrent muscle relaxants:Flexeril Current anti-anxiolytic:none Current sleep aide:none Current Antihypertensive medications:lisinopril-HCTZ Current  Antidepressant medications:none Current Anticonvulsant medications:none Current anti-CGRP:Aimovig 36m40mrrent Vitamins/Herbal/Supplements:magnesium 400mg42mboflavin 400mg,76mnzyme Q10 300mg, 6mrrent Antihistamines/Decongestants:Flonase Other therapy:none Hormone/birth control:none Other medications:none  Caffeine:1 cup of coffee daily Alcohol:No Smoker:No Diet:Mediterranean diet. Drinks over 100 oz water daily Exercise:Cardio and weight lifting 5 days a week Depression:no; Anxiety:no Other pain:Low back pain Sleep:  4-5 hours a night  HISTORY: Onset:  20s Location:Unilateral (either side), rarely in back of head and neck Quality:Throbbing if severe Initial intensity:7/10.Shedenies new headache, thunderclap headache or severe headache that wakes herfrom sleep. Aura:no Premonitory Phase:no Postdrome:lethargy Associated symptoms:Photophobia, phonophobia, vertigo, sometimes nausea.Shedenies associated visual disturbance, autonomic symptoms orunilateral numbness or weakness. Initial duration:If sumatriptan take early, then an hour; if wakes up with a migraine, it may take 2 hours Initial frequency:Initially 10 to 12 days a month; since Dec 2019-Jan 2020, they occurred 3 or 4 days a month (she attributes this to diet, hydration and exercise) Initial frequency of abortive medication:3 or 4 times a month. Triggers:  Salt, emotional stress, menses Relieving factors:  None Activity:aggravates  Past NSAIDS:Advil Past analgesics:Tylenol, Excedrin Past abortive triptans:none Past abortive ergotamine:none Past muscle relaxants:none Past anti-emetic:none Past antihypertensive medications:none Past antidepressant medications:nortriptyline (elevated blood pressure) Past anticonvulsant medications:topiramate 100mg(ef35mive but caused paresthesias), zonisamide 100mg Pas22mti-CGRP:none Past  vitamins/Herbal/Supplements:none Past antihistamines/decongestants:none Other past therapies:Continuous hormone therapy  Family history of headache:Grandmother, grandmother's sister, her 3 children  Past Medical History: Past Medical History:  Diagnosis Date  . Hypertension   . Migraines   . Thyroid disease     Medications: Outpatient Encounter Medications as of 06/21/2019  Medication Sig  . amoxicillin-clavulanate (AUGMENTIN) 875-125 MG tablet Take 1 tablet by mouth 2 (two) times daily.  . benzonatate (TESSALON PERLES) 100 MG capsule Take 1 capsule (100 mg total) by mouth 3 (three) times daily as needed.  .Marland Kitchen  Coenzyme Q10 10 MG capsule Take 300 mg by mouth daily.  . Collagen Hydrolysate, Bovine, POWD by Does not apply route. One tablespoon twice daily  . cyclobenzaprine (FLEXERIL) 10 MG tablet Take 10 mg by mouth at bedtime as needed for muscle spasms.  Eduard Roux (AIMOVIG) 70 MG/ML SOAJ Inject 70 mg into the skin every 30 (thirty) days.  . fluticasone (FLONASE) 50 MCG/ACT nasal spray Place 2 sprays into both nostrils daily.  Marland Kitchen lisinopril-hydrochlorothiazide (ZESTORETIC) 20-12.5 MG tablet Take 1 tablet by mouth daily.  . naproxen (NAPROSYN) 500 MG tablet Take 1 tablet (500 mg total) by mouth every 12 (twelve) hours as needed.  . nortriptyline (PAMELOR) 10 MG capsule TAKE 1 CAPSULE (10 MG TOTAL) BY MOUTH AT BEDTIME.  Marland Kitchen ondansetron (ZOFRAN) 8 MG tablet Take 1 tablet (8 mg total) by mouth every 8 (eight) hours as needed for nausea or vomiting.  . Riboflavin 400 MG CAPS Take 1 mg by mouth daily.  . SB MAGNESIUM CITRATE PO Take 400 mg by mouth daily.  . SUMAtriptan (IMITREX) 100 MG tablet TAKE 1 TABLET BY MOUTH AT ONSET OF MIGRAINE.MAY REPEAT IN 2 HOURS IF HEADACHE PERSISTS OR RECURS.  . Vitamin D, Ergocalciferol, (DRISDOL) 1.25 MG (50000 UT) CAPS capsule Take 1 capsule (50,000 Units total) by mouth every 7 (seven) days. REPEAT LABS REQUIRED PRIOR TO ANY FURTHER REFILLS   No  facility-administered encounter medications on file as of 06/21/2019.    Allergies: Allergies  Allergen Reactions  . Beeswax Other (See Comments)  . Codeine Nausea And Vomiting    Headaches.    Family History: Family History  Problem Relation Age of Onset  . Depression Mother   . Diabetes Mother   . Hyperlipidemia Mother   . Hypertension Mother   . Obesity Mother   . Dementia Mother   . Renal Disease Mother   . Alcohol abuse Father   . Hyperlipidemia Maternal Grandmother   . Hypertension Maternal Grandmother   . Cancer Maternal Grandfather        lymphoma  . Heart attack Maternal Grandfather   . Depression Maternal Grandfather   . Hyperlipidemia Maternal Grandfather   . Hypertension Maternal Grandfather     Social History: Social History   Socioeconomic History  . Marital status: Married    Spouse name: Mia Creek  . Number of children: 3  . Years of education: Not on file  . Highest education level: Master's degree (e.g., MA, MS, MEng, MEd, MSW, MBA)  Occupational History  . Occupation: Passenger transport manager: Programmer, applications  Tobacco Use  . Smoking status: Never Smoker  . Smokeless tobacco: Never Used  Substance and Sexual Activity  . Alcohol use: No  . Drug use: No  . Sexual activity: Yes    Partners: Male    Birth control/protection: Surgical, Post-menopausal  Other Topics Concern  . Not on file  Social History Narrative   Patient is right-handed. She lives with her husband in a one level home. She drinks 3-5 large cups of coffee a day. She states she has not been exercising lately.   Social Determinants of Health   Financial Resource Strain:   . Difficulty of Paying Living Expenses:   Food Insecurity:   . Worried About Charity fundraiser in the Last Year:   . Arboriculturist in the Last Year:   Transportation Needs:   . Film/video editor (Medical):   Marland Kitchen Lack of Transportation (Non-Medical):   Physical Activity:   .  Days of Exercise per  Week:   . Minutes of Exercise per Session:   Stress:   . Feeling of Stress :   Social Connections:   . Frequency of Communication with Friends and Family:   . Frequency of Social Gatherings with Friends and Family:   . Attends Religious Services:   . Active Member of Clubs or Organizations:   . Attends Archivist Meetings:   Marland Kitchen Marital Status:   Intimate Partner Violence:   . Fear of Current or Ex-Partner:   . Emotionally Abused:   Marland Kitchen Physically Abused:   . Sexually Abused:     Observations/Objective:   Height 5' 5"  (1.651 m), weight 242 lb (109.8 kg), last menstrual period 08/18/2017. No acute distress.  Alert and oriented.  Speech fluent and not dysarthric.  Language intact.  Eyes orthophoric on primary gaze.  Face symmetric.  Assessment and Plan:   Migraine without aura, without status migrainosus, not intractable.  Much improved but they tend to occur the last week prior to next injection.  1.  For preventative management, we will try to further reduce frequency by increasing Aimovig to 141m monthly 2.  For abortive therapy, sumatriptan 1013mwith or without naproxen (with naproxen if wakes up with migraine) 3.  Limit use of pain relievers to no more than 2 days out of week to prevent risk of rebound or medication-overuse headache. 4.  Keep headache diary 5.  Exercise, hydration, caffeine cessation, sleep hygiene, monitor for and avoid triggers 6. Follow up 6 months.   Follow Up Instructions:    -I discussed the assessment and treatment plan with the patient. The patient was provided an opportunity to ask questions and all were answered. The patient agreed with the plan and demonstrated an understanding of the instructions.   The patient was advised to call back or seek an in-person evaluation if the symptoms worsen or if the condition fails to improve as anticipated.   AdDudley MajorDO

## 2019-06-21 ENCOUNTER — Other Ambulatory Visit: Payer: Self-pay

## 2019-06-21 ENCOUNTER — Telehealth (INDEPENDENT_AMBULATORY_CARE_PROVIDER_SITE_OTHER): Payer: BC Managed Care – PPO | Admitting: Neurology

## 2019-06-21 ENCOUNTER — Encounter: Payer: Self-pay | Admitting: Neurology

## 2019-06-21 VITALS — Ht 65.0 in | Wt 242.0 lb

## 2019-06-21 DIAGNOSIS — G43009 Migraine without aura, not intractable, without status migrainosus: Secondary | ICD-10-CM | POA: Diagnosis not present

## 2019-06-21 MED ORDER — AIMOVIG 140 MG/ML ~~LOC~~ SOAJ: 140.0000 mg | SUBCUTANEOUS | 11 refills | Status: DC

## 2019-08-01 ENCOUNTER — Other Ambulatory Visit: Payer: Self-pay | Admitting: Neurology

## 2019-09-15 ENCOUNTER — Other Ambulatory Visit: Payer: Self-pay | Admitting: Neurology

## 2019-10-14 ENCOUNTER — Other Ambulatory Visit: Payer: Self-pay | Admitting: Neurology

## 2019-10-14 DIAGNOSIS — G43009 Migraine without aura, not intractable, without status migrainosus: Secondary | ICD-10-CM

## 2019-11-22 ENCOUNTER — Other Ambulatory Visit: Payer: Self-pay | Admitting: Adult Health

## 2019-12-20 NOTE — Progress Notes (Signed)
Virtual Visit via Video Note The purpose of this virtual visit is to provide medical care while limiting exposure to the novel coronavirus.    Consent was obtained for video visit:  Yes. Answered questions that patient had about telehealth interaction:  Yes. I discussed the limitations, risks, security and privacy concerns of performing an evaluation and management service by telemedicine. I also discussed with the patient that there may be a patient responsible charge related to this service. The patient expressed understanding and agreed to proceed.  Pt location: Home Physician Location: office Name of referring provider:  Esaw Grandchild, NP I connected with Susan Barber at patients initiation/request on 12/22/2019 at  8:30 AM EDT by video enabled telemedicine application and verified that I am speaking with the correct person using two identifiers. Pt MRN:  [id number] Pt DOB:  03-25-1970 Video Participants:  Susan Barber;     History of Present Illness:  Susan Barber is a 49 year old right-handed woman with hypertension and thyroid disease who follows up for migraines  UPDATE: Increased Aimovig to 155m in April. Intensity:7/10 Duration:Up to 2 hours Frequency:4 or 5 at most (mostly last week prior to next injection). Frequency of abortive medication:4-5 days a month Rescue protocol: sumatriptan; sumatriptan with naproxen if wakes up with headache. Current NSAIDS:Naproxen5063mCurrent analgesics:none Current triptans:Sumatriptan 10048murrent ergotamine:none Current anti-emetic:Zofran 8mg29mrrent muscle relaxants:Flexeril Current anti-anxiolytic:none Current sleep aide:none Current Antihypertensive medications:lisinopril-HCTZ Current Antidepressant medications:none Current Anticonvulsant medications:none Current anti-CGRP:Aimovig 140mg48mrent Vitamins/Herbal/Supplements:magnesium 400mg,108moflavin 400mg, 8mzyme  Q10 300mg, D40mrent Antihistamines/Decongestants:Flonase Other therapy:none Hormone/birth control:none Other medications:none  Caffeine:1 cup of coffee daily Alcohol:No Smoker:No Diet:Mediterranean diet. Drinks over 100 oz water daily Exercise:Cardio and weight lifting 5 days a week Depression:no; Anxiety:no Other pain:Low back pain Sleep:  4-5 hours a night  HISTORY: Onset: 20s Location:Unilateral (either side), rarely in back of head and neck Quality:Throbbing if severe Initial intensity:7/10.Shedenies new headache, thunderclap headache or severe headache that wakes herfrom sleep. Aura:no Premonitory Phase:no Postdrome:lethargy Associated symptoms:Photophobia, phonophobia, vertigo, sometimes nausea.Shedenies associated visual disturbance, autonomic symptoms orunilateral numbness or weakness. Initial duration:If sumatriptan take early, then an hour; if wakes up with a migraine, it may take 2 hours Initial frequency:Initially 10 to 12 days a month; since Dec 2019-Jan 2020,they occurred 3 or 4 days a month (she attributes this to diet, hydration and exercise) Initial frequency of abortive medication:3 or 4 times a month. Triggers: Salt, emotional stress, menses Relieving factors: None Activity:aggravates  Past NSAIDS:Advil Past analgesics:Tylenol, Excedrin Past abortive triptans:none Past abortive ergotamine:none Past muscle relaxants:none Past anti-emetic:none Past antihypertensive medications:none Past antidepressant medications:nortriptyline (elevated blood pressure) Past anticonvulsant medications:topiramate 100mg(eff14mve but caused paresthesias), zonisamide 100mg Past63mi-CGRP:none Past vitamins/Herbal/Supplements:none Past antihistamines/decongestants:none Other past therapies:Continuous hormone therapy  Family history of headache:Grandmother, grandmother's sister, her 3  children   Past Medical History: Past Medical History:  Diagnosis Date  . Hypertension   . Migraines   . Thyroid disease     Medications: Outpatient Encounter Medications as of 12/22/2019  Medication Sig  . Collagen Hydrolysate, Bovine, POWD by Does not apply route. One tablespoon twice daily  . Erenumab-aooe (AIMOVIG) 140 MG/ML SOAJ Inject 140 mg into the skin every 30 (thirty) days.  . fluticasone (FLONASE) 50 MCG/ACT nasal spray Place 2 sprays into both nostrils daily.  . lisinoprMarland Kitchenl-hydrochlorothiazide (ZESTORETIC) 20-12.5 MG tablet Take 1 tablet by mouth daily.  . naproxen (NAPROSYN) 500 MG tablet TAKE 1 TABLET (500 MG TOTAL) BY MOUTH EVERY 12 (TWELVE) HOURS AS NEEDED.  .Marland Kitchen  SUMAtriptan (IMITREX) 100 MG tablet TAKE 1 TABLET BY MOUTH AT ONSET OF MIGRAINE.MAY REPEAT IN 2 HOURS IF HEADACHE PERSISTS OR RECURS.  . Vitamin D, Ergocalciferol, (DRISDOL) 1.25 MG (50000 UT) CAPS capsule Take 1 capsule (50,000 Units total) by mouth every 7 (seven) days. REPEAT LABS REQUIRED PRIOR TO ANY FURTHER REFILLS   No facility-administered encounter medications on file as of 12/22/2019.    Allergies: Allergies  Allergen Reactions  . Beeswax Other (See Comments)  . Codeine Nausea And Vomiting    Headaches.    Family History: Family History  Problem Relation Age of Onset  . Depression Mother   . Diabetes Mother   . Hyperlipidemia Mother   . Hypertension Mother   . Obesity Mother   . Dementia Mother   . Renal Disease Mother   . Alcohol abuse Father   . Hyperlipidemia Maternal Grandmother   . Hypertension Maternal Grandmother   . Cancer Maternal Grandfather        lymphoma  . Heart attack Maternal Grandfather   . Depression Maternal Grandfather   . Hyperlipidemia Maternal Grandfather   . Hypertension Maternal Grandfather     Social History: Social History   Socioeconomic History  . Marital status: Married    Spouse name: Mia Creek  . Number of children: 3  . Years of education: Not on  file  . Highest education level: Master's degree (e.g., MA, MS, MEng, MEd, MSW, MBA)  Occupational History  . Occupation: Passenger transport manager: Programmer, applications  Tobacco Use  . Smoking status: Never Smoker  . Smokeless tobacco: Never Used  Vaping Use  . Vaping Use: Never used  Substance and Sexual Activity  . Alcohol use: No  . Drug use: No  . Sexual activity: Yes    Partners: Male    Birth control/protection: Surgical, Post-menopausal  Other Topics Concern  . Not on file  Social History Narrative   Patient is right-handed. She lives with her husband in a one level home. She drinks 3-5 large cups of coffee a day. She states she has not been exercising lately.   Social Determinants of Health   Financial Resource Strain:   . Difficulty of Paying Living Expenses: Not on file  Food Insecurity:   . Worried About Charity fundraiser in the Last Year: Not on file  . Ran Out of Food in the Last Year: Not on file  Transportation Needs:   . Lack of Transportation (Medical): Not on file  . Lack of Transportation (Non-Medical): Not on file  Physical Activity:   . Days of Exercise per Week: Not on file  . Minutes of Exercise per Session: Not on file  Stress:   . Feeling of Stress : Not on file  Social Connections:   . Frequency of Communication with Friends and Family: Not on file  . Frequency of Social Gatherings with Friends and Family: Not on file  . Attends Religious Services: Not on file  . Active Member of Clubs or Organizations: Not on file  . Attends Archivist Meetings: Not on file  . Marital Status: Not on file  Intimate Partner Violence:   . Fear of Current or Ex-Partner: Not on file  . Emotionally Abused: Not on file  . Physically Abused: Not on file  . Sexually Abused: Not on file    Observations/Objective:   Height 5' 5"  (1.651 m), weight 198 lb (89.8 kg), last menstrual period 08/18/2017. No acute distress.  Alert and  oriented.  Speech fluent  and not dysarthric.  Language intact.    Assessment and Plan:   Migraine without aura, without status migrainosus, not intractable  PLAN: 1.  For preventative management, Aimovig 166m 2.  For abortive therapy, sumatriptan 1057mwith or without naproxen 50040mwith naproxen if wakes up with migraine) 3.  Limit use of pain relievers to no more than 2 days out of week to prevent risk of rebound or medication-overuse headache. 4.  Keep headache diary 5.  Exercise, hydration, caffeine cessation, sleep hygiene, monitor for and avoid triggers 6.  Follow up 9 months  Follow Up Instructions:    -I discussed the assessment and treatment plan with the patient. The patient was provided an opportunity to ask questions and all were answered. The patient agreed with the plan and demonstrated an understanding of the instructions.   The patient was advised to call back or seek an in-person evaluation if the symptoms worsen or if the condition fails to improve as anticipated.   Celso Granja R. JafMilford MillO  CC: KatMina MarbleP

## 2019-12-22 ENCOUNTER — Encounter: Payer: Self-pay | Admitting: Neurology

## 2019-12-22 ENCOUNTER — Telehealth (INDEPENDENT_AMBULATORY_CARE_PROVIDER_SITE_OTHER): Payer: BC Managed Care – PPO | Admitting: Neurology

## 2019-12-22 ENCOUNTER — Other Ambulatory Visit: Payer: Self-pay

## 2019-12-22 VITALS — Ht 65.0 in | Wt 198.0 lb

## 2019-12-22 DIAGNOSIS — G43009 Migraine without aura, not intractable, without status migrainosus: Secondary | ICD-10-CM | POA: Diagnosis not present

## 2020-01-09 ENCOUNTER — Telehealth: Payer: BC Managed Care – PPO | Admitting: Emergency Medicine

## 2020-01-09 DIAGNOSIS — J329 Chronic sinusitis, unspecified: Secondary | ICD-10-CM

## 2020-01-09 MED ORDER — AMOXICILLIN-POT CLAVULANATE 875-125 MG PO TABS: 1.0000 | ORAL_TABLET | Freq: Two times a day (BID) | ORAL | 0 refills | Status: DC

## 2020-01-09 NOTE — Progress Notes (Signed)
We are sorry that you are not feeling well.  Here is how we plan to help!  Based on what you have shared with me it looks like you have sinusitis.  Sinusitis is inflammation and infection in the sinus cavities of the head.  Based on your presentation I believe you most likely have Acute Bacterial Sinusitis.  This is an infection caused by bacteria and is treated with antibiotics. I have prescribed Augmentin 871m/125mg one tablet twice daily with food, for 7 days. You may use an oral decongestant such as Mucinex D or if you have glaucoma or high blood pressure use plain Mucinex. Saline nasal spray help and can safely be used as often as needed for congestion.  If you develop worsening sinus pain, fever or notice severe headache and vision changes, or if symptoms are not better after completion of antibiotic, please schedule an appointment with a health care provider.    Sinus infections are not as easily transmitted as other respiratory infection, however we still recommend that you avoid close contact with loved ones, especially the very young and elderly.  Remember to wash your hands thoroughly throughout the day as this is the number one way to prevent the spread of infection!  Home Care:  Only take medications as instructed by your medical team.  Complete the entire course of an antibiotic.  Do not take these medications with alcohol.  A steam or ultrasonic humidifier can help congestion.  You can place a towel over your head and breathe in the steam from hot water coming from a faucet.  Avoid close contacts especially the very young and the elderly.  Cover your mouth when you cough or sneeze.  Always remember to wash your hands.  Get Help Right Away If:  You develop worsening fever or sinus pain.  You develop a severe head ache or visual changes.  Your symptoms persist after you have completed your treatment plan.  Make sure you  Understand these instructions.  Will watch your  condition.  Will get help right away if you are not doing well or get worse.  Your e-visit answers were reviewed by a board certified advanced clinical practitioner to complete your personal care plan.  Depending on the condition, your plan could have included both over the counter or prescription medications.  If there is a problem please reply  once you have received a response from your provider.  Your safety is important to [place]  If you have drug allergies check your prescription carefully.    You can use MyChart to ask questions about today's visit, request a non-urgent call back, or ask for a work or school excuse for 24 hours related to this e-Visit. If it has been greater than 24 hours you will need to follow up with your provider, or enter a new e-Visit to address those concerns.  You will get an e-mail in the next two days asking about your experience.  I hope that your e-visit has been valuable and will speed your recovery. Thank you for using e-visits.   Approximately 5 minutes was used in reviewing the patient's chart, questionnaire, prescribing medications, and documentation.

## 2020-01-14 ENCOUNTER — Other Ambulatory Visit: Payer: Self-pay | Admitting: Neurology

## 2020-01-14 DIAGNOSIS — G43009 Migraine without aura, not intractable, without status migrainosus: Secondary | ICD-10-CM

## 2020-03-14 ENCOUNTER — Encounter: Payer: Self-pay | Admitting: Neurology

## 2020-03-14 NOTE — Progress Notes (Signed)
Rumaisa Dealmeida ([facility]) Rx #: [id number] Aimovig 140MG/ML auto-injectors   Form [employer] Electronic PA Form [phone] NCPDP) Created 6 days ago Sent to Plan 6 days ago Plan Response 6 days ago Submit Clinical Questions 6 days ago Determination Favorable 6 days ago Message from [employer] Your PA request has been approved. Additional information will be provided in the approval communication. (Message 1145)  Received fax approval valid from 03/08/20 to 03/08/21.

## 2020-03-20 ENCOUNTER — Telehealth: Payer: BC Managed Care – PPO | Admitting: Emergency Medicine

## 2020-03-20 DIAGNOSIS — J329 Chronic sinusitis, unspecified: Secondary | ICD-10-CM

## 2020-03-20 MED ORDER — AMOXICILLIN-POT CLAVULANATE 875-125 MG PO TABS: 1.0000 | ORAL_TABLET | Freq: Two times a day (BID) | ORAL | 0 refills | Status: DC

## 2020-03-20 NOTE — Progress Notes (Signed)
We are sorry that you are not feeling well.  Here is how we plan to help!  Based on what you have shared with me it looks like you have sinusitis.  Sinusitis is inflammation and infection in the sinus cavities of the head.  Based on your presentation I believe you most likely have Acute Bacterial Sinusitis.  This is an infection caused by bacteria and is treated with antibiotics. I have prescribed Augmentin 853m/125mg one tablet twice daily with food, for 7 days. You may use an oral decongestant such as Mucinex D or if you have glaucoma or high blood pressure use plain Mucinex. Saline nasal spray help and can safely be used as often as needed for congestion.  If you develop worsening sinus pain, fever or notice severe headache and vision changes, or if symptoms are not better after completion of antibiotic, please schedule an appointment with a health care provider.    Sinus infections are not as easily transmitted as other respiratory infection, however we still recommend that you avoid close contact with loved ones, especially the very young and elderly.  Remember to wash your hands thoroughly throughout the day as this is the number one way to prevent the spread of infection!  Home Care:  Only take medications as instructed by your medical team.  Complete the entire course of an antibiotic.  Do not take these medications with alcohol.  A steam or ultrasonic humidifier can help congestion.  You can place a towel over your head and breathe in the steam from hot water coming from a faucet.  Avoid close contacts especially the very young and the elderly.  Cover your mouth when you cough or sneeze.  Always remember to wash your hands.  Get Help Right Away If:  You develop worsening fever or sinus pain.  You develop a severe head ache or visual changes.  Your symptoms persist after you have completed your treatment plan.  Make sure you  Understand these instructions.  Will watch your  condition.  Will get help right away if you are not doing well or get worse.  Your e-visit answers were reviewed by a board certified advanced clinical practitioner to complete your personal care plan.  Depending on the condition, your plan could have included both over the counter or prescription medications.  If there is a problem please reply  once you have received a response from your provider.  Your safety is important to [place]  If you have drug allergies check your prescription carefully.    You can use MyChart to ask questions about today's visit, request a non-urgent call back, or ask for a work or school excuse for 24 hours related to this e-Visit. If it has been greater than 24 hours you will need to follow up with your provider, or enter a new e-Visit to address those concerns.  You will get an e-mail in the next two days asking about your experience.  I hope that your e-visit has been valuable and will speed your recovery. Thank you for using e-visits.   Approximately 5 minutes was used in reviewing the patient's chart, questionnaire, prescribing medications, and documentation.

## 2020-06-02 ENCOUNTER — Other Ambulatory Visit: Payer: Self-pay | Admitting: Neurology

## 2020-06-05 IMAGING — US ULTRASOUND ABDOMEN COMPLETE
1 series · 13 of 25 positions shown · non-contrast
Comparison: None.

CLINICAL DATA: Upper abdominal pain

EXAM:
ABDOMEN ULTRASOUND COMPLETE

[Series 1: ultrasound abdomen complete · 0.28mm/px · 13 of 87 slices shown]
[im 1/87]
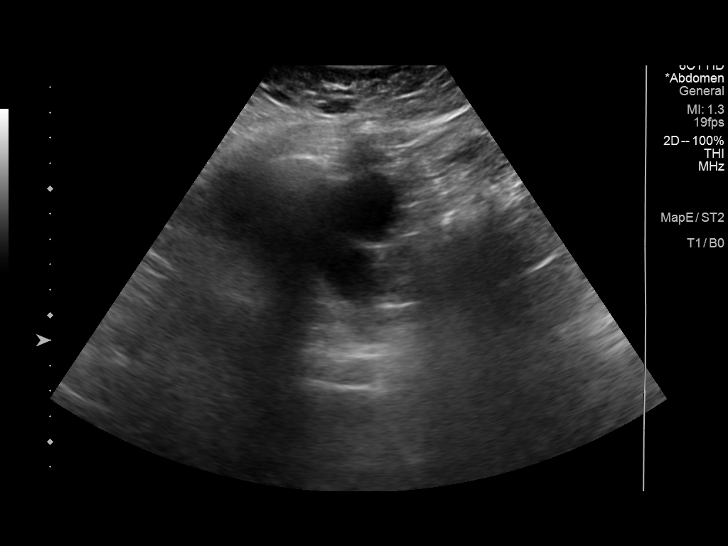
[im 8/87]
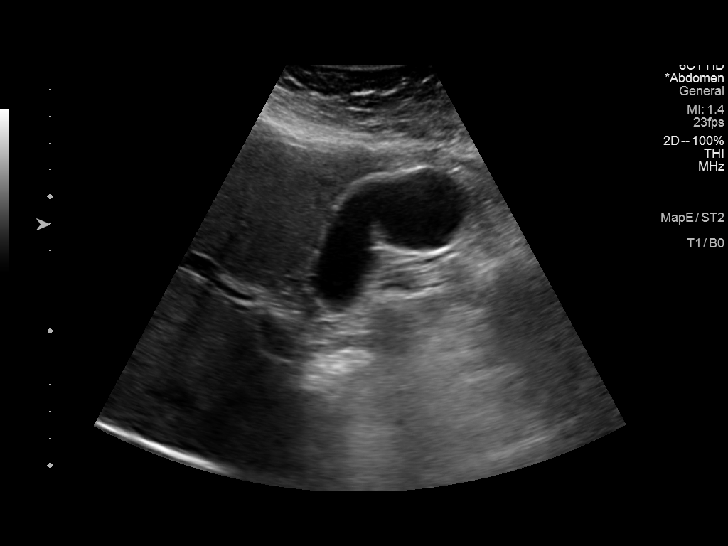
[im 15/87]
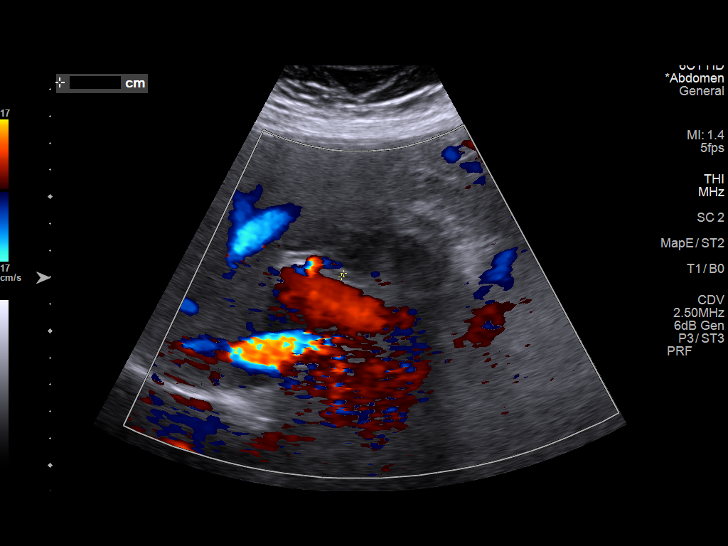
[im 22/87]
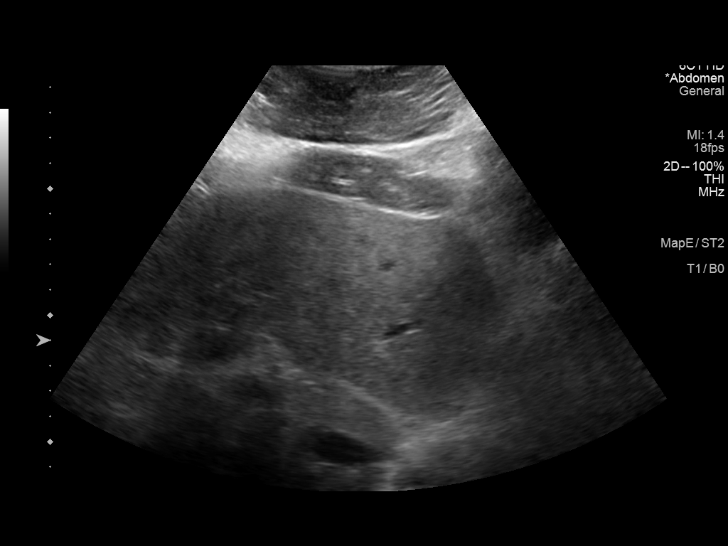
[im 29/87]
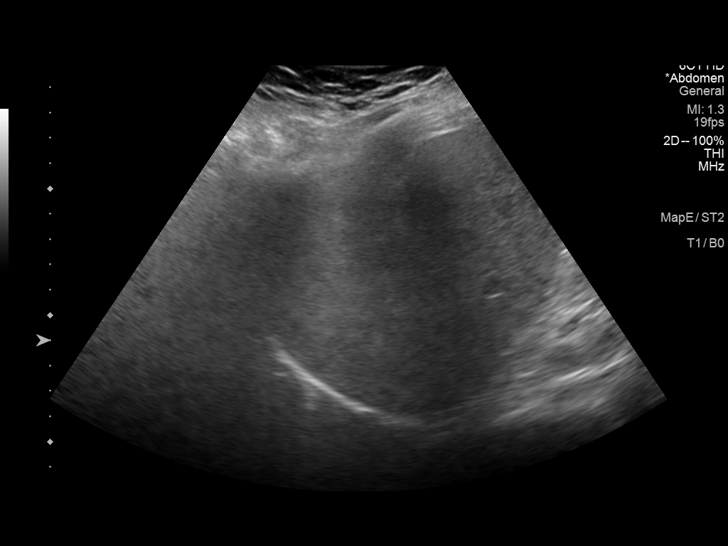
[im 36/87]
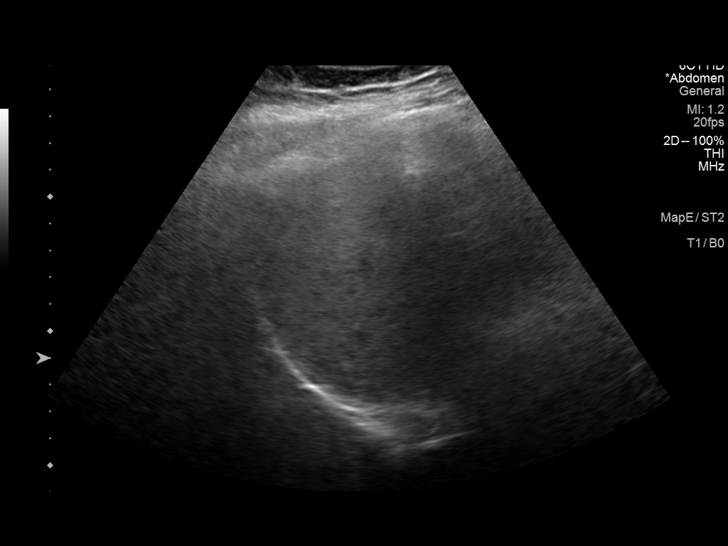
[im 44/87]
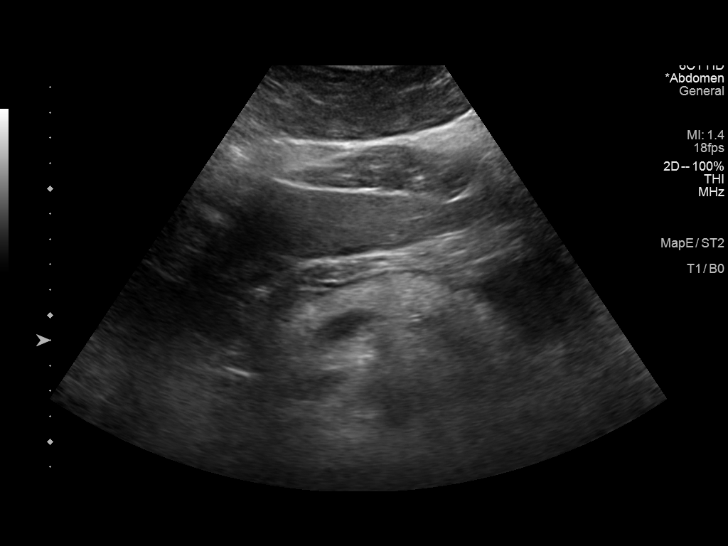
[im 51/87]
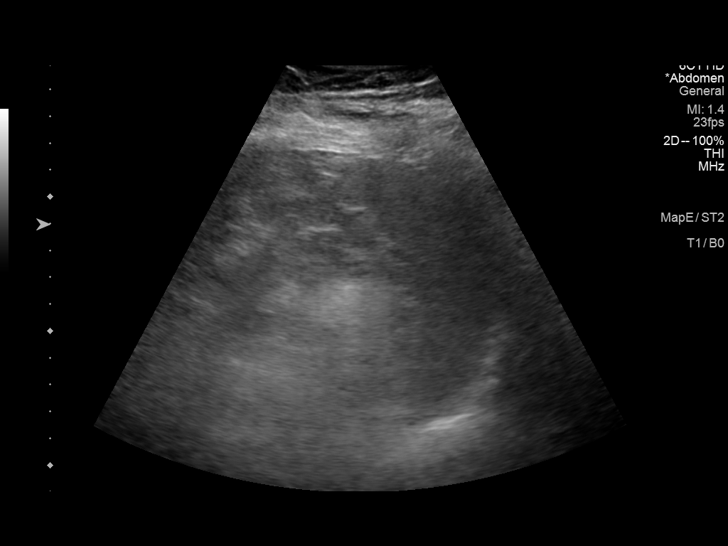
[im 58/87]
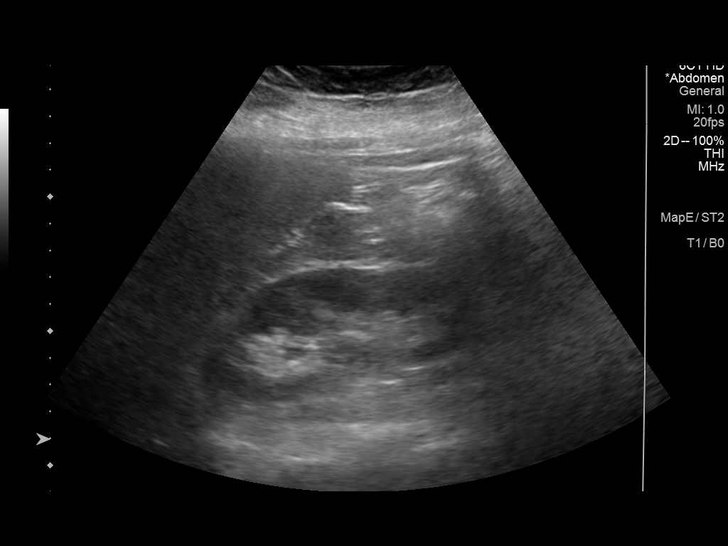
[im 65/87]
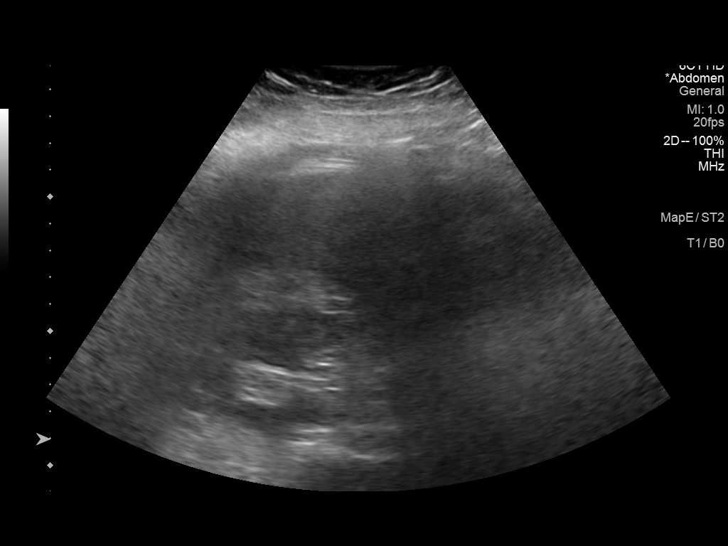
[im 72/87]
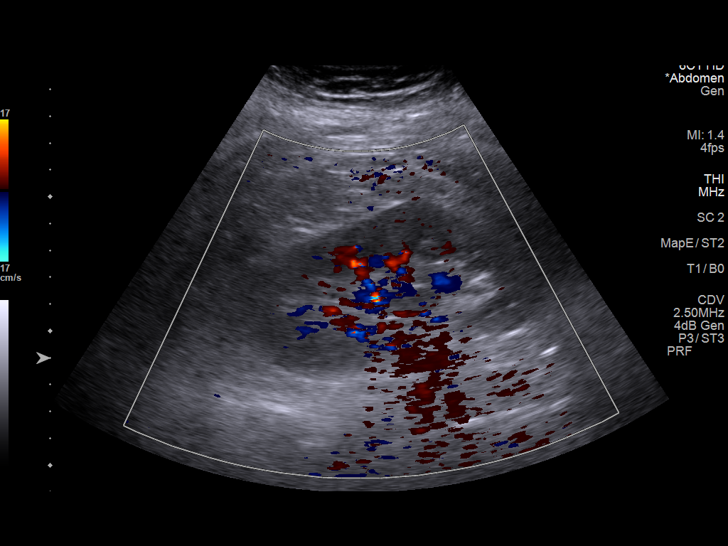
[im 79/87]
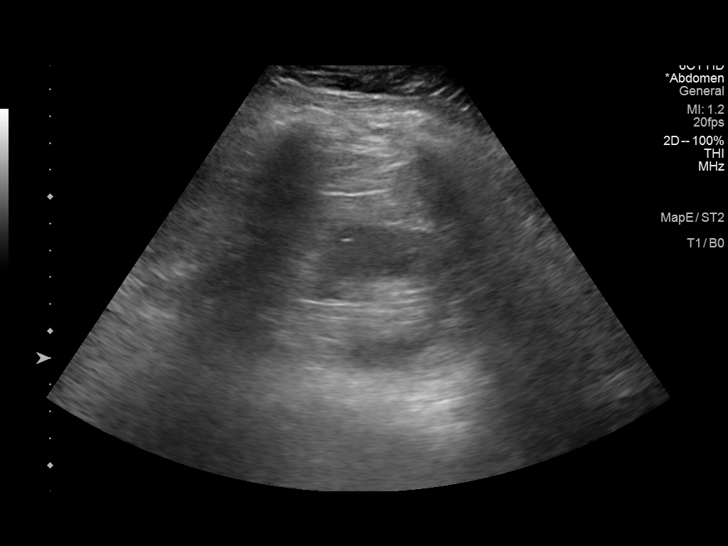
[im 87/87]
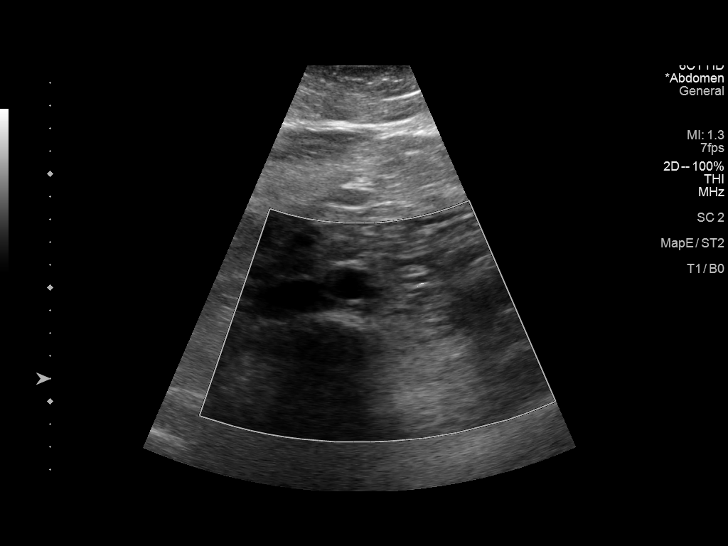

[13 of 25 positions shown; findings below may reference images not displayed]

FINDINGS: Gallbladder: No gallstones or wall thickening visualized. There is
no pericholecystic fluid. No sonographic Murphy sign noted by
sonographer.

Common bile duct: Diameter: 2 mm. No intrahepatic, common hepatic,
or common bile duct dilatation. Note that portions of the common
bile duct are obscured by gas.

Liver: No focal lesion identified. Liver echogenicity is increased
diffusely. Portal vein is patent on color Doppler imaging with
normal direction of blood flow towards the liver.

IVC: No abnormality visualized.

Pancreas: No pancreatic mass or inflammatory focus.

Spleen: Size and appearance within normal limits.

Right Kidney: Length: 11.3 cm. Echogenicity within normal limits. No
mass or hydronephrosis visualized.

Left Kidney: Length: 11.6 cm. Echogenicity within normal limits. No
mass or hydronephrosis visualized.

Abdominal aorta: No aneurysm visualized.

Other findings: No demonstrable ascites.
IMPRESSION: Diffuse increase in liver echogenicity, a finding indicative of
hepatic steatosis. While no focal liver lesions are evident on this
study, it must be cautioned that the sensitivity of ultrasound for
detection of focal liver lesions is diminished in this circumstance.

Study otherwise unremarkable.

## 2020-06-28 ENCOUNTER — Other Ambulatory Visit: Payer: Self-pay | Admitting: Neurology

## 2020-07-26 LAB — EXTERNAL GENERIC LAB PROCEDURE: COLOGUARD: NEGATIVE

## 2020-08-09 ENCOUNTER — Telehealth: Payer: Self-pay | Admitting: Physician Assistant

## 2020-08-09 DIAGNOSIS — B9689 Other specified bacterial agents as the cause of diseases classified elsewhere: Secondary | ICD-10-CM

## 2020-08-09 DIAGNOSIS — J019 Acute sinusitis, unspecified: Secondary | ICD-10-CM

## 2020-08-09 MED ORDER — DOXYCYCLINE HYCLATE 100 MG PO CAPS: 100.0000 mg | ORAL_CAPSULE | Freq: Two times a day (BID) | ORAL | 0 refills | Status: DC

## 2020-08-09 NOTE — Progress Notes (Signed)
We are sorry that you are not feeling well.  Here is how we plan to help!  Based on what you have shared with me it looks like you have sinusitis.  Sinusitis is inflammation and infection in the sinus cavities of the head.  Based on your presentation I believe you most likely have Acute Bacterial Sinusitis.  This is an infection caused by bacteria and is treated with antibiotics. I have prescribed Doxycycline 156m by mouth twice a day for 10 days. You may use an oral decongestant such as Mucinex D or if you have glaucoma or high blood pressure use plain Mucinex. Saline nasal spray help and can safely be used as often as needed for congestion.  If you develop worsening sinus pain, fever or notice severe headache and vision changes, or if symptoms are not better after completion of antibiotic, please schedule an appointment with a health care provider.    Sinus infections are not as easily transmitted as other respiratory infection, however we still recommend that you avoid close contact with loved ones, especially the very young and elderly.  Remember to wash your hands thoroughly throughout the day as this is the number one way to prevent the spread of infection!  Home Care:  Only take medications as instructed by your medical team.  Complete the entire course of an antibiotic.  Do not take these medications with alcohol.  A steam or ultrasonic humidifier can help congestion.  You can place a towel over your head and breathe in the steam from hot water coming from a faucet.  Avoid close contacts especially the very young and the elderly.  Cover your mouth when you cough or sneeze.  Always remember to wash your hands.  Get Help Right Away If:  You develop worsening fever or sinus pain.  You develop a severe head ache or visual changes.  Your symptoms persist after you have completed your treatment plan.  Make sure you  Understand these instructions.  Will watch your  condition.  Will get help right away if you are not doing well or get worse.  Your e-visit answers were reviewed by a board certified advanced clinical practitioner to complete your personal care plan.  Depending on the condition, your plan could have included both over the counter or prescription medications.  If there is a problem please reply  once you have received a response from your provider.  Your safety is important to [place]  If you have drug allergies check your prescription carefully.    You can use MyChart to ask questions about today's visit, request a non-urgent call back, or ask for a work or school excuse for 24 hours related to this e-Visit. If it has been greater than 24 hours you will need to follow up with your provider, or enter a new e-Visit to address those concerns.  You will get an e-mail in the next two days asking about your experience.  I hope that your e-visit has been valuable and will speed your recovery. Thank you for using e-visits.

## 2020-08-09 NOTE — Progress Notes (Signed)
I have spent 5 minutes in review of e-visit questionnaire, review and updating patient chart, medical decision making and response to patient.   Rochelle Larue Cody Jalon Blackwelder, PA-C    

## 2020-09-14 ENCOUNTER — Other Ambulatory Visit: Payer: Self-pay | Admitting: Neurology

## 2020-09-15 ENCOUNTER — Other Ambulatory Visit: Payer: Self-pay | Admitting: Neurology

## 2020-09-18 NOTE — Progress Notes (Deleted)
NEUROLOGY FOLLOW UP OFFICE NOTE  Susan Barber [id number]  Assessment/Plan:   Migraine without aura, without status migrainosus, not intractable  Migraine prevention:  Aimovig 14m Q4wks Migraine rescue:  sumatriptan 1068mwith naproxen if wakes up with migraine Limit use of pain relievers to no more than 2 days out of week to prevent risk of rebound or medication-overuse headache. Keep headache diary Follow up ***   Subjective:  Susan Barber is a [age]ear old right-handed woman with hypertension and thyroid disease who follows up for migraines   UPDATE: Increased Aimovig to 14034mn April. Intensity:  7/10 Duration:  Up to 2 hours Frequency:  4 or 5 at most (mostly last week prior to next injection). Frequency of abortive medication: 4-5 days a month Rescue protocol:  sumatriptan; sumatriptan with naproxen if wakes up with headache. Current NSAIDS:  Naproxen 500m29murrent analgesics:  none Current triptans:  Sumatriptan 100mg60mrent ergotamine:  none Current anti-emetic:  Zofran 8mg C27ment muscle relaxants:  Flexeril Current anti-anxiolytic:  none Current sleep aide:  none Current Antihypertensive medications:  lisinopril-HCTZ Current Antidepressant medications:  none Current Anticonvulsant medications:  none Current anti-CGRP:  Aimovig 140mg C32mnt Vitamins/Herbal/Supplements:  magnesium 400mg, r2mlavin 400mg, co63mme Q10 300mg, D C57mnt Antihistamines/Decongestants:  Flonase Other therapy:  none Hormone/birth control:  none Other medications:  none   Caffeine:  1 cup of coffee daily Alcohol:  No Smoker:  No Diet:  Mediterranean diet. Drinks over 100 oz water daily Exercise:  Cardio and weight lifting 5 days a week Depression:  no; Anxiety:  no Other pain:  Low back pain Sleep:  4-5 hours a night   HISTORY: Onset:  20s Location:  Unilateral (either side), rarely in back of head and neck Quality:  Throbbing if severe Initial  intensity:  7/10.  She denies new headache, thunderclap headache or severe headache that wakes her from sleep. Aura:  no Premonitory Phase:  no Postdrome:  lethargy Associated symptoms:  Photophobia, phonophobia, vertigo, sometimes nausea.  She denies associated visual disturbance, autonomic symptoms or unilateral numbness or weakness. Initial duration:  If sumatriptan take early, then an hour; if wakes up with a migraine, it may take 2 hours Initial frequency:  Initially 10 to 12 days a month; since Dec 2019-Jan 2020, they occurred 3 or 4 days a month (she attributes this to diet, hydration and exercise) Initial frequency of abortive medication:3 or 4 times a month. Triggers:  Salt, emotional stress, menses Relieving factors:  None Activity:  aggravates   Past NSAIDS:  Advil Past analgesics:  Tylenol, Excedrin Past abortive triptans:  none Past abortive ergotamine:  none Past muscle relaxants:  none Past anti-emetic:  none Past antihypertensive medications:  none Past antidepressant medications:  nortriptyline (elevated blood pressure) Past anticonvulsant medications:  topiramate 100mg (effe4me but caused paresthesias), zonisamide 100mg Past a23mCGRP:  none Past vitamins/Herbal/Supplements:  none Past antihistamines/decongestants:  none Other past therapies:  Continuous hormone therapy   Family history of headache:  Grandmother, grandmother's sister, her 3 children    PAST MEDICAL HISTORY: Past Medical History:  Diagnosis Date   Hypertension    Migraines    Thyroid disease     MEDICATIONS: Current Outpatient Medications on File Prior to Visit  Medication Sig Dispense Refill   AIMOVIG 140 MG/ML SOAJ INJECT 140 MG INTO THE SKIN EVERY 30 (THIRTY) DAYS. 1 mL 2   Collagen Hydrolysate, Bovine, POWD by Does not apply route. One tablespoon twice daily     doxycycline (VIBRAMYCIN)  100 MG capsule Take 1 capsule (100 mg total) by mouth 2 (two) times daily. 20 capsule 0    fluticasone (FLONASE) 50 MCG/ACT nasal spray Place 2 sprays into both nostrils daily. 16 g 6   lisinopril-hydrochlorothiazide (ZESTORETIC) 20-12.5 MG tablet Take 1 tablet by mouth daily. 90 tablet 3   naproxen (NAPROSYN) 500 MG tablet TAKE 1 TABLET (500 MG TOTAL) BY MOUTH EVERY 12 (TWELVE) HOURS AS NEEDED. 20 tablet 8   SUMAtriptan (IMITREX) 100 MG tablet TAKE 1 TABLET BY MOUTH AT ONSET OF MIGRAINE.MAY REPEAT IN 2 HOURS IF HEADACHE PERSISTS OR RECURS. 10 tablet 8   Vitamin D, Ergocalciferol, (DRISDOL) 1.25 MG (50000 UT) CAPS capsule Take 1 capsule (50,000 Units total) by mouth every 7 (seven) days. REPEAT LABS REQUIRED PRIOR TO ANY FURTHER REFILLS 4 capsule 0   No current facility-administered medications on file prior to visit.    ALLERGIES: Allergies  Allergen Reactions   Beeswax Other (See Comments)   Codeine Nausea And Vomiting    Headaches.    FAMILY HISTORY: Family History  Problem Relation Age of Onset   Depression Mother    Diabetes Mother    Hyperlipidemia Mother    Hypertension Mother    Obesity Mother    Dementia Mother    Renal Disease Mother    Alcohol abuse Father    Hyperlipidemia Maternal Grandmother    Hypertension Maternal Grandmother    Cancer Maternal Grandfather        lymphoma   Heart attack Maternal Grandfather    Depression Maternal Grandfather    Hyperlipidemia Maternal Grandfather    Hypertension Maternal Grandfather       Objective:  *** General: No acute distress.  Patient appears well-groomed.   Head:  Normocephalic/atraumatic Eyes:  Fundi examined but not visualized Neck: supple, no paraspinal tenderness, full range of motion Heart:  Regular rate and rhythm Lungs:  Clear to auscultation bilaterally Back: No paraspinal tenderness Neurological Exam: alert and oriented to person, place, and time.  Speech fluent and not dysarthric, language intact.  CN II-XII intact. Bulk and tone normal, muscle strength 5/5 throughout.  Sensation to light  touch intact.  Deep tendon reflexes 2+ throughout, toes downgoing.  Finger to nose testing intact.  Gait normal, Romberg negative.   Metta Clines, DO  CC: Susan Marble, NP

## 2020-09-20 ENCOUNTER — Ambulatory Visit: Payer: BC Managed Care – PPO | Admitting: Neurology

## 2020-12-13 ENCOUNTER — Other Ambulatory Visit: Payer: Self-pay | Admitting: Neurology

## 2020-12-13 ENCOUNTER — Other Ambulatory Visit: Payer: Self-pay

## 2020-12-13 MED ORDER — AIMOVIG 140 MG/ML ~~LOC~~ SOAJ: 140.0000 mg | SUBCUTANEOUS | 0 refills | Status: DC

## 2020-12-13 NOTE — Telephone Encounter (Signed)
Aimovig refilled 09/2020 with 2 refills to last to the patient one year. 1 more refill sent to the pharmacy. Advised in a mychart message we will not be able to fill any further refills past October 2022.

## 2020-12-24 ENCOUNTER — Other Ambulatory Visit: Payer: Self-pay | Admitting: Neurology

## 2021-01-08 ENCOUNTER — Other Ambulatory Visit: Payer: Self-pay | Admitting: Neurology

## 2021-01-19 ENCOUNTER — Other Ambulatory Visit: Payer: Self-pay | Admitting: Neurology

## 2021-01-19 DIAGNOSIS — G43009 Migraine without aura, not intractable, without status migrainosus: Secondary | ICD-10-CM

## 2021-01-20 ENCOUNTER — Other Ambulatory Visit: Payer: Self-pay | Admitting: Neurology

## 2021-01-30 NOTE — Progress Notes (Signed)
NEUROLOGY FOLLOW UP OFFICE NOTE  Susan Barber [id number]  Assessment/Plan:   Migraine without aura, without status migrainosus, not intractable  Migraine prevention:  Aimovig 131m Migraine rescue:  sumatriptan 1079mwith or without naproxen 50029mwith naproxen if wakes up with migraine) Limit use of pain relievers to no more than 2 days out of week to prevent risk of rebound or medication-overuse headache. Keep headache diary Follow up one year   Subjective:  Susan Barber is a [age]ar old right-handed woman with hypertension and thyroid disease who follows up for migraines   UPDATE: Intensity:  7/10 Duration: 1 to 2 hours Frequency:  3 a month (mostly last week prior to next injection). Frequency of abortive medication: 4-5 days a month Rescue protocol:  sumatriptan; sumatriptan with naproxen if wakes up with headache. Current NSAIDS:  Naproxen 500m21murrent analgesics:  none Current triptans:  Sumatriptan 100mg69mrent ergotamine:  none Current anti-emetic:  Zofran 8mg C7ment muscle relaxants:  Flexeril Current anti-anxiolytic:  none Current sleep aide:  none Current Antihypertensive medications:  lisinopril-HCTZ Current Antidepressant medications:  none Current Anticonvulsant medications:  none Current anti-CGRP:  Aimovig 140mg C39mnt Vitamins/Herbal/Supplements:  magnesium 400mg, r77mlavin 400mg, co28mme Q10 300mg, D C26mnt Antihistamines/Decongestants:  Flonase Other therapy:  none Hormone/birth control:  none Other medications:  none   Caffeine:  1 cup of coffee daily Alcohol:  No Smoker:  No Diet:  Mediterranean diet. Drinks over 100 oz water daily Exercise:  Cardio and weight lifting 5 days a week Depression:  no; Anxiety:  no Other pain:  Low back pain Sleep:  4-5 hours a night   HISTORY: Onset:  20s Location:  Unilateral (either side), rarely in back of head and neck Quality:  Throbbing if severe Initial intensity:  7/10.  She  denies new headache, thunderclap headache or severe headache that wakes her from sleep. Aura:  no Premonitory Phase:  no Postdrome:  lethargy Associated symptoms:  Photophobia, phonophobia, vertigo, sometimes nausea.  She denies associated visual disturbance, autonomic symptoms or unilateral numbness or weakness. Initial duration:  If sumatriptan take early, then an hour; if wakes up with a migraine, it may take 2 hours Initial frequency:  Initially 10 to 12 days a month Initial frequency of abortive medication:3 or 4 times a month. Triggers:  Salt, emotional stress, menses Relieving factors:  None Activity:  aggravates   Past NSAIDS:  Advil Past analgesics:  Tylenol, Excedrin Past abortive triptans:  none Past abortive ergotamine:  none Past muscle relaxants:  none Past anti-emetic:  none Past antihypertensive medications:  none Past antidepressant medications:  nortriptyline (elevated blood pressure) Past anticonvulsant medications:  topiramate 100mg (effe63me but caused paresthesias), zonisamide 100mg Past a69mCGRP:  none Past vitamins/Herbal/Supplements:  none Past antihistamines/decongestants:  none Other past therapies:  Continuous hormone therapy   Family history of headache:  Grandmother, grandmother's sister, her 3 children  PAST MEDICAL HISTORY: Past Medical History:  Diagnosis Date   Hypertension    Migraines    Thyroid disease     MEDICATIONS: Current Outpatient Medications on File Prior to Visit  Medication Sig Dispense Refill   Collagen Hydrolysate, Bovine, POWD by Does not apply route. One tablespoon twice daily     doxycycline (VIBRAMYCIN) 100 MG capsule Take 1 capsule (100 mg total) by mouth 2 (two) times daily. 20 capsule 0   Erenumab-aooe (AIMOVIG) 140 MG/ML SOAJ Inject 140 mg into the skin every 30 (thirty) days. 1 mL 0   fluticasone (FLONASE) 50 MCG/ACT  nasal spray Place 2 sprays into both nostrils daily. 16 g 6   lisinopril-hydrochlorothiazide  (ZESTORETIC) 20-12.5 MG tablet Take 1 tablet by mouth daily. 90 tablet 3   naproxen (NAPROSYN) 500 MG tablet TAKE 1 TABLET (500 MG TOTAL) BY MOUTH EVERY 12 (TWELVE) HOURS AS NEEDED. 20 tablet 8   SUMAtriptan (IMITREX) 100 MG tablet TAKE 1 TABLET BY MOUTH AT ONSET OF MIGRAINE.MAY REPEAT IN 2 HOURS IF HEADACHE PERSISTS OR RECURS. 10 tablet 0   Vitamin D, Ergocalciferol, (DRISDOL) 1.25 MG (50000 UT) CAPS capsule Take 1 capsule (50,000 Units total) by mouth every 7 (seven) days. REPEAT LABS REQUIRED PRIOR TO ANY FURTHER REFILLS 4 capsule 0   No current facility-administered medications on file prior to visit.    ALLERGIES: Allergies  Allergen Reactions   Beeswax Other (See Comments)   Codeine Nausea And Vomiting    Headaches.    FAMILY HISTORY: Family History  Problem Relation Age of Onset   Depression Mother    Diabetes Mother    Hyperlipidemia Mother    Hypertension Mother    Obesity Mother    Dementia Mother    Renal Disease Mother    Alcohol abuse Father    Hyperlipidemia Maternal Grandmother    Hypertension Maternal Grandmother    Cancer Maternal Grandfather        lymphoma   Heart attack Maternal Grandfather    Depression Maternal Grandfather    Hyperlipidemia Maternal Grandfather    Hypertension Maternal Grandfather       Objective:  Blood pressure (!) 166/89, pulse 83, height 5' 5"  (1.651 m), weight 242 lb 12.8 oz (110.1 kg), last menstrual period 08/18/2017, SpO2 97 %. General: No acute distress.  Patient appears well-groomed.   Head:  Normocephalic/atraumatic Eyes:  Fundi examined but not visualized Neck: supple, no paraspinal tenderness, full range of motion Heart:  Regular rate and rhythm Lungs:  Clear to auscultation bilaterally Back: No paraspinal tenderness Neurological Exam: alert and oriented to person, place, and time.  Speech fluent and not dysarthric, language intact.  CN II-XII intact. Bulk and tone normal, muscle strength 5/5 throughout.  Sensation to  light touch intact.  Deep tendon reflexes 2+ throughout, toes downgoing.  Finger to nose testing intact.  Gait normal, Romberg negative.   Metta Clines, DO  CC: Susan Marble, NP

## 2021-01-31 ENCOUNTER — Encounter: Payer: Self-pay | Admitting: Neurology

## 2021-01-31 ENCOUNTER — Ambulatory Visit: Payer: BC Managed Care – PPO | Admitting: Neurology

## 2021-01-31 ENCOUNTER — Other Ambulatory Visit: Payer: Self-pay

## 2021-01-31 DIAGNOSIS — G43009 Migraine without aura, not intractable, without status migrainosus: Secondary | ICD-10-CM | POA: Diagnosis not present

## 2021-01-31 MED ORDER — NAPROXEN 500 MG PO TABS: 500.0000 mg | ORAL_TABLET | Freq: Two times a day (BID) | ORAL | 5 refills | Status: DC | PRN

## 2021-01-31 MED ORDER — SUMATRIPTAN SUCCINATE 100 MG PO TABS: ORAL_TABLET | ORAL | 5 refills | Status: DC

## 2021-01-31 MED ORDER — AIMOVIG 140 MG/ML ~~LOC~~ SOAJ: 140.0000 mg | SUBCUTANEOUS | 5 refills | Status: DC

## 2021-01-31 NOTE — Patient Instructions (Signed)
Refilled: Aimovig 137m Sumatriptan 1043mNaproxen 50092m

## 2021-02-25 ENCOUNTER — Telehealth: Payer: BC Managed Care – PPO | Admitting: Family

## 2021-02-25 DIAGNOSIS — J019 Acute sinusitis, unspecified: Secondary | ICD-10-CM

## 2021-02-25 MED ORDER — AMOXICILLIN-POT CLAVULANATE 875-125 MG PO TABS: 1.0000 | ORAL_TABLET | Freq: Two times a day (BID) | ORAL | 0 refills | Status: DC

## 2021-02-25 MED ORDER — BENZONATATE 100 MG PO CAPS
100.0000 mg | ORAL_CAPSULE | Freq: Three times a day (TID) | ORAL | 0 refills | Status: DC | PRN
Start: 2021-02-25 — End: 2022-02-05

## 2021-02-25 NOTE — Progress Notes (Signed)
E-Visit for Sinus Problems  We are sorry that you are not feeling well.  Here is how we plan to help!  Based on what you have shared with me it looks like you have sinusitis.  Sinusitis is inflammation and infection in the sinus cavities of the head.  Based on your presentation I believe you most likely have Acute Bacterial Sinusitis.  This is an infection caused by bacteria and is treated with antibiotics. I have prescribed Augmentin 854m/125mg one tablet twice daily with food, for 7 days. You may use an oral decongestant such as Mucinex D or if you have glaucoma or high blood pressure use plain Mucinex. Saline nasal spray help and can safely be used as often as needed for congestion.  If you develop worsening sinus pain, fever or notice severe headache and vision changes, or if symptoms are not better after completion of antibiotic, please schedule an appointment with a health care provider.    Sinus infections are not as easily transmitted as other respiratory infection, however we still recommend that you avoid close contact with loved ones, especially the very young and elderly.  Remember to wash your hands thoroughly throughout the day as this is the number one way to prevent the spread of infection!  Home Care: Only take medications as instructed by your medical team. Complete the entire course of an antibiotic. Do not take these medications with alcohol. A steam or ultrasonic humidifier can help congestion.  You can place a towel over your head and breathe in the steam from hot water coming from a faucet. Avoid close contacts especially the very young and the elderly. Cover your mouth when you cough or sneeze. Always remember to wash your hands.  Get Help Right Away If: You develop worsening fever or sinus pain. You develop a severe head ache or visual changes. Your symptoms persist after you have completed your treatment plan.  Make sure you Understand these instructions. Will watch  your condition. Will get help right away if you are not doing well or get worse.  Thank you for choosing an e-visit.  Your e-visit answers were reviewed by a board certified advanced clinical practitioner to complete your personal care plan. Depending upon the condition, your plan could have included both over the counter or prescription medications.  Please review your pharmacy choice. Make sure the pharmacy is open so you can pick up prescription now. If there is a problem, you may contact your provider through [employer] have the prescription routed to another pharmacy.  Your safety is important to [place] If you have drug allergies check your prescription carefully.   For the next 24 hours you can use MyChart to ask questions about today's visit, request a non-urgent call back, or ask for a work or school excuse. You will get an email in the next two days asking about your experience. I hope that your e-visit has been valuable and will speed your recovery.   Approximately 5 minutes was spent documenting and reviewing patient's chart.

## 2021-03-07 ENCOUNTER — Telehealth: Payer: Self-pay

## 2021-03-07 NOTE — Telephone Encounter (Signed)
New message   Fax sent to the plan Your PA has been faxed to the plan as a paper copy. Please contact the plan directly if you haven't received a determination in a typical timeframe.  You will be notified of the determination electronically and via fax.   Breniya Kearn (Key: [id number]) Aimovig 140MG/ML auto-injectors   Form [facility] Non-Medicare Formulary Exception / Prior Authorization Request Form  Plan Contact [phone] phone [phone] fax Created 2 hours ago Sent to Plan 1 minute ago Determination

## 2021-03-12 NOTE — Telephone Encounter (Signed)
F/u   Received fax from [facility]   The request is denied - Aimovig 140 mg   Appeal & clinical office notes fax back over to [phone].  Per Dr. Tomi Likens the patient is not taking another CGRP.

## 2021-03-16 ENCOUNTER — Encounter (INDEPENDENT_AMBULATORY_CARE_PROVIDER_SITE_OTHER): Payer: Self-pay

## 2021-05-21 ENCOUNTER — Telehealth: Payer: BC Managed Care – PPO | Admitting: Nurse Practitioner

## 2021-05-21 DIAGNOSIS — J0140 Acute pansinusitis, unspecified: Secondary | ICD-10-CM

## 2021-05-21 MED ORDER — DOXYCYCLINE HYCLATE 100 MG PO TABS
100.0000 mg | ORAL_TABLET | Freq: Two times a day (BID) | ORAL | 0 refills | Status: AC
Start: 2021-05-21 — End: 2021-05-31

## 2021-05-21 NOTE — Progress Notes (Signed)
E-Visit for Sinus Problems ? ?We are sorry that you are not feeling well.  Here is how we plan to help! ? ?Based on what you have shared with me it looks like you have sinusitis.  Sinusitis is inflammation and infection in the sinus cavities of the head.  Based on your presentation I believe you most likely have Acute Bacterial Sinusitis.  This is an infection caused by bacteria and is treated with antibiotics. I have prescribed Doxycycline 134m by mouth twice a day for 10 days. You may use an oral decongestant such as Mucinex D or if you have glaucoma or high blood pressure use plain Mucinex. Saline nasal spray help and can safely be used as often as needed for congestion.  If you develop worsening sinus pain, fever or notice severe headache and vision changes, or if symptoms are not better after completion of antibiotic, please schedule an appointment with a health care provider.   ? ?Sinus infections are not as easily transmitted as other respiratory infection, however we still recommend that you avoid close contact with loved ones, especially the very young and elderly.  Remember to wash your hands thoroughly throughout the day as this is the number one way to prevent the spread of infection! ? ?Home Care: ?Only take medications as instructed by your medical team. ?Complete the entire course of an antibiotic. ?Do not take these medications with alcohol. ?A steam or ultrasonic humidifier can help congestion.  You can place a towel over your head and breathe in the steam from hot water coming from a faucet. ?Avoid close contacts especially the very young and the elderly. ?Cover your mouth when you cough or sneeze. ?Always remember to wash your hands. ? ?Get Help Right Away If: ?You develop worsening fever or sinus pain. ?You develop a severe head ache or visual changes. ?Your symptoms persist after you have completed your treatment plan. ? ?Make sure you ?Understand these instructions. ?Will watch your  condition. ?Will get help right away if you are not doing well or get worse. ? ?Thank you for choosing an e-visit. ? ?Your e-visit answers were reviewed by a board certified advanced clinical practitioner to complete your personal care plan. Depending upon the condition, your plan could have included both over the counter or prescription medications. ? ?Please review your pharmacy choice. Make sure the pharmacy is open so you can pick up prescription now. If there is a problem, you may contact your provider through [employer] have the prescription routed to another pharmacy.  Your safety is important to [place] If you have drug allergies check your prescription carefully.  ? ?For the next 24 hours you can use MyChart to ask questions about today's visit, request a non-urgent call back, or ask for a work or school excuse. ?You will get an email in the next two days asking about your experience. I hope that your e-visit has been valuable and will speed your recovery.  ? ?Meds ordered this encounter  ?Medications  ? doxycycline (VIBRA-TABS) 100 MG tablet  ?  Sig: Take 1 tablet (100 mg total) by mouth 2 (two) times daily for 10 days.  ?  Dispense:  20 tablet  ?  Refill:  0  ?  ?I spent approximately 5 minutes reviewing the patient's history, current symptoms and coordinating their plan of care today.   ?

## 2021-08-01 ENCOUNTER — Other Ambulatory Visit: Payer: Self-pay | Admitting: Neurology

## 2021-10-08 ENCOUNTER — Other Ambulatory Visit: Payer: Self-pay | Admitting: Neurology

## 2021-10-08 DIAGNOSIS — G43009 Migraine without aura, not intractable, without status migrainosus: Secondary | ICD-10-CM

## 2021-10-11 ENCOUNTER — Other Ambulatory Visit: Payer: Self-pay | Admitting: Neurology

## 2021-10-23 ENCOUNTER — Encounter (INDEPENDENT_AMBULATORY_CARE_PROVIDER_SITE_OTHER): Payer: Self-pay

## 2022-01-10 ENCOUNTER — Other Ambulatory Visit: Payer: Self-pay | Admitting: Neurology

## 2022-01-30 NOTE — Progress Notes (Deleted)
NEUROLOGY FOLLOW UP OFFICE NOTE  [name] [id number]  Assessment/Plan:   Migraine without aura, without status migrainosus, not intractable  Migraine prevention:  Aimovig 140mg  Migraine rescue:  sumatriptan 100mg  with or without naproxen 500mg  (with naproxen if wakes up with migraine) Limit use of pain relievers to no more than 2 days out of week to prevent risk of rebound or medication-overuse headache. Keep headache diary Follow up one year ***   Subjective:  Susan Barber is a 51 year old right-handed woman with hypertension and thyroid disease who follows up for migraines   UPDATE: Intensity:  7/10 Duration: 1 to 2 hours Frequency:  3 a month (mostly last week prior to next injection). Frequency of abortive medication: 4-5 days a month Rescue protocol:  sumatriptan; sumatriptan with naproxen if wakes up with headache. Current NSAIDS:  Naproxen 500mg   Current analgesics:  none Current triptans:  Sumatriptan 100mg  Current ergotamine:  none Current anti-emetic:  Zofran 8mg  Current muscle relaxants:  Flexeril Current anti-anxiolytic:  none Current sleep aide:  none Current Antihypertensive medications:  lisinopril-HCTZ Current Antidepressant medications:  none Current Anticonvulsant medications:  none Current anti-CGRP:  Aimovig 140mg  Current Vitamins/Herbal/Supplements:  magnesium 400mg , riboflavin 400mg , coenzyme Q10 300mg , D Current Antihistamines/Decongestants:  Flonase Other therapy:  none Hormone/birth control:  none Other medications:  none   Caffeine:  1 cup of coffee daily Alcohol:  No Smoker:  No Diet:  Mediterranean diet. Drinks over 100 oz water daily Exercise:  Cardio and weight lifting 5 days a week Depression:  no; Anxiety:  no Other pain:  Low back pain Sleep:  4-5 hours a night   HISTORY: Onset:  20s Location:  Unilateral (either side), rarely in back of head and neck Quality:  Throbbing if severe Initial intensity:  7/10.   She denies new headache, thunderclap headache or severe headache that wakes her from sleep. Aura:  no Premonitory Phase:  no Postdrome:  lethargy Associated symptoms:  Photophobia, phonophobia, vertigo, sometimes nausea.  She denies associated visual disturbance, autonomic symptoms or unilateral numbness or weakness. Initial duration:  If sumatriptan take early, then an hour; if wakes up with a migraine, it may take 2 hours Initial frequency:  Initially 10 to 12 days a month Initial frequency of abortive medication:3 or 4 times a month. Triggers:  Salt, emotional stress, menses Relieving factors:  None Activity:  aggravates   Past NSAIDS:  Advil Past analgesics:  Tylenol, Excedrin Past abortive triptans:  none Past abortive ergotamine:  none Past muscle relaxants:  none Past anti-emetic:  none Past antihypertensive medications:  none Past antidepressant medications:  nortriptyline (elevated blood pressure) Past anticonvulsant medications:  topiramate 100mg  (effective but caused paresthesias), zonisamide 100mg  Past anti-CGRP:  none Past vitamins/Herbal/Supplements:  none Past antihistamines/decongestants:  none Other past therapies:  Continuous hormone therapy   Family history of headache:  Grandmother, grandmother's sister, her 3 children  PAST MEDICAL HISTORY: Past Medical History:  Diagnosis Date   Hypertension    Migraines    Thyroid disease     MEDICATIONS: Current Outpatient Medications on File Prior to Visit  Medication Sig Dispense Refill   AIMOVIG 140 MG/ML SOAJ INJECT 140 MG INTO THE SKIN EVERY 30 DAYS 1 mL 2   benzonatate (TESSALON PERLES) 100 MG capsule Take 1 capsule (100 mg total) by mouth 3 (three) times daily as needed. 20 capsule 0   Collagen Hydrolysate, Bovine, POWD by Does not apply route. One tablespoon twice daily     fluticasone (FLONASE)  50 MCG/ACT nasal spray Place 2 sprays into both nostrils daily. (Patient not taking: Reported on 01/31/2021) 16 g 6    lisinopril-hydrochlorothiazide (ZESTORETIC) 20-12.5 MG tablet Take 1 tablet by mouth daily. 90 tablet 3   naproxen (NAPROSYN) 500 MG tablet TAKE 1 TABLET (500 MG TOTAL) BY MOUTH EVERY 12 (TWELVE) HOURS AS NEEDED. 20 tablet 3   SUMAtriptan (IMITREX) 100 MG tablet MAY REPEAT IN 2 HOURS IF HEADACHE PERSISTS OR RECURS. 10 tablet 0   Vitamin D, Ergocalciferol, (DRISDOL) 1.25 MG (50000 UT) CAPS capsule Take 1 capsule (50,000 Units total) by mouth every 7 (seven) days. REPEAT LABS REQUIRED PRIOR TO ANY FURTHER REFILLS (Patient not taking: Reported on 01/31/2021) 4 capsule 0   No current facility-administered medications on file prior to visit.    ALLERGIES: Allergies  Allergen Reactions   Beeswax Other (See Comments)   Codeine Nausea And Vomiting    Headaches.    FAMILY HISTORY: Family History  Problem Relation Age of Onset   Depression Mother    Diabetes Mother    Hyperlipidemia Mother    Hypertension Mother    Obesity Mother    Dementia Mother    Renal Disease Mother    Alcohol abuse Father    Hyperlipidemia Maternal Grandmother    Hypertension Maternal Grandmother    Cancer Maternal Grandfather        lymphoma   Heart attack Maternal Grandfather    Depression Maternal Grandfather    Hyperlipidemia Maternal Grandfather    Hypertension Maternal Grandfather       Objective:  *** General: No acute distress.  Patient appears well-groomed.   Head:  Normocephalic/atraumatic Eyes:  Fundi examined but not visualized Neck: supple, no paraspinal tenderness, full range of motion Heart:  Regular rate and rhythm Neurological Exam: ***   Susan Millet, DO  CC: William Hamburger, NP

## 2022-01-31 ENCOUNTER — Encounter: Payer: Self-pay | Admitting: Neurology

## 2022-02-03 ENCOUNTER — Ambulatory Visit: Payer: BC Managed Care – PPO | Admitting: Neurology

## 2022-02-04 NOTE — Progress Notes (Unsigned)
NEUROLOGY FOLLOW UP OFFICE NOTE  Susan Barber [id number]  Assessment/Plan:   Migraine without aura, without status migrainosus, not intractable  Migraine prevention:  Aimovig 140mg  Migraine rescue:  sumatriptan 100mg  with or without naproxen 500mg  (with naproxen if wakes up with migraine) Limit use of pain relievers to no more than 2 days out of week to prevent risk of rebound or medication-overuse headache. Keep headache diary Follow up one year ***   Subjective:  Susan Barber is a 51 year old right-handed woman with hypertension and thyroid disease who follows up for migraines   UPDATE: Intensity:  7/10 Duration: 1 to 2 hours Frequency:  3 a month (mostly last week prior to next injection). Frequency of abortive medication: 4-5 days a month Rescue protocol:  sumatriptan; sumatriptan with naproxen if wakes up with headache. Current NSAIDS:  Naproxen 500mg   Current analgesics:  none Current triptans:  Sumatriptan 100mg  Current ergotamine:  none Current anti-emetic:  Zofran 8mg  Current muscle relaxants:  Flexeril Current anti-anxiolytic:  none Current sleep aide:  none Current Antihypertensive medications:  lisinopril-HCTZ Current Antidepressant medications:  none Current Anticonvulsant medications:  none Current anti-CGRP:  Aimovig 140mg  Current Vitamins/Herbal/Supplements:  magnesium 400mg , riboflavin 400mg , coenzyme Q10 300mg , D Current Antihistamines/Decongestants:  Flonase Other therapy:  none Hormone/birth control:  none Other medications:  none   Caffeine:  1 cup of coffee daily Alcohol:  No Smoker:  No Diet:  Mediterranean diet. Drinks over 100 oz water daily Exercise:  Cardio and weight lifting 5 days a week Depression:  no; Anxiety:  no Other pain:  Low back pain Sleep:  4-5 hours a night   HISTORY: Onset:  20s Location:  Unilateral (either side), rarely in back of head and neck Quality:  Throbbing if severe Initial intensity:  7/10.   She denies new headache, thunderclap headache or severe headache that wakes her from sleep. Aura:  no Premonitory Phase:  no Postdrome:  lethargy Associated symptoms:  Photophobia, phonophobia, vertigo, sometimes nausea.  She denies associated visual disturbance, autonomic symptoms or unilateral numbness or weakness. Initial duration:  If sumatriptan take early, then an hour; if wakes up with a migraine, it may take 2 hours Initial frequency:  Initially 10 to 12 days a month Initial frequency of abortive medication:3 or 4 times a month. Triggers:  Salt, emotional stress, menses Relieving factors:  None Activity:  aggravates   Past NSAIDS:  Advil Past analgesics:  Tylenol, Excedrin Past abortive triptans:  none Past abortive ergotamine:  none Past muscle relaxants:  none Past anti-emetic:  none Past antihypertensive medications:  none Past antidepressant medications:  nortriptyline (elevated blood pressure) Past anticonvulsant medications:  topiramate 100mg  (effective but caused paresthesias), zonisamide 100mg  Past anti-CGRP:  none Past vitamins/Herbal/Supplements:  none Past antihistamines/decongestants:  none Other past therapies:  Continuous hormone therapy   Family history of headache:  Grandmother, grandmother's sister, her 3 children  PAST MEDICAL HISTORY: Past Medical History:  Diagnosis Date   Hypertension    Migraines    Thyroid disease     MEDICATIONS: Current Outpatient Medications on File Prior to Visit  Medication Sig Dispense Refill   AIMOVIG 140 MG/ML SOAJ INJECT 140 MG INTO THE SKIN EVERY 30 DAYS 1 mL 2   benzonatate (TESSALON PERLES) 100 MG capsule Take 1 capsule (100 mg total) by mouth 3 (three) times daily as needed. 20 capsule 0   Collagen Hydrolysate, Bovine, POWD by Does not apply route. One tablespoon twice daily     fluticasone (FLONASE)  50 MCG/ACT nasal spray Place 2 sprays into both nostrils daily. (Patient not taking: Reported on 01/31/2021) 16 g 6    lisinopril-hydrochlorothiazide (ZESTORETIC) 20-12.5 MG tablet Take 1 tablet by mouth daily. 90 tablet 3   naproxen (NAPROSYN) 500 MG tablet TAKE 1 TABLET (500 MG TOTAL) BY MOUTH EVERY 12 (TWELVE) HOURS AS NEEDED. 20 tablet 3   SUMAtriptan (IMITREX) 100 MG tablet MAY REPEAT IN 2 HOURS IF HEADACHE PERSISTS OR RECURS. 10 tablet 0   Vitamin D, Ergocalciferol, (DRISDOL) 1.25 MG (50000 UT) CAPS capsule Take 1 capsule (50,000 Units total) by mouth every 7 (seven) days. REPEAT LABS REQUIRED PRIOR TO ANY FURTHER REFILLS (Patient not taking: Reported on 01/31/2021) 4 capsule 0   No current facility-administered medications on file prior to visit.    ALLERGIES: Allergies  Allergen Reactions   Beeswax Other (See Comments)   Codeine Nausea And Vomiting    Headaches.    FAMILY HISTORY: Family History  Problem Relation Age of Onset   Depression Mother    Diabetes Mother    Hyperlipidemia Mother    Hypertension Mother    Obesity Mother    Dementia Mother    Renal Disease Mother    Alcohol abuse Father    Hyperlipidemia Maternal Grandmother    Hypertension Maternal Grandmother    Cancer Maternal Grandfather        lymphoma   Heart attack Maternal Grandfather    Depression Maternal Grandfather    Hyperlipidemia Maternal Grandfather    Hypertension Maternal Grandfather       Objective:  *** General: No acute distress.  Patient appears well-groomed.   Head:  Normocephalic/atraumatic Eyes:  Fundi examined but not visualized Neck: supple, no paraspinal tenderness, full range of motion Heart:  Regular rate and rhythm Neurological Exam: ***   Susan Clines, DO  CC: Susan Marble, NP

## 2022-02-05 ENCOUNTER — Encounter: Payer: Self-pay | Admitting: Neurology

## 2022-02-05 ENCOUNTER — Ambulatory Visit: Payer: BC Managed Care – PPO | Admitting: Neurology

## 2022-02-05 VITALS — BP 172/90 | HR 84 | Ht 65.0 in | Wt 251.2 lb

## 2022-02-05 DIAGNOSIS — G43009 Migraine without aura, not intractable, without status migrainosus: Secondary | ICD-10-CM | POA: Diagnosis not present

## 2022-02-05 DIAGNOSIS — I10 Essential (primary) hypertension: Secondary | ICD-10-CM

## 2022-02-05 MED ORDER — SUMATRIPTAN SUCCINATE 100 MG PO TABS: ORAL_TABLET | ORAL | 11 refills | Status: DC

## 2022-02-05 MED ORDER — EMGALITY 120 MG/ML ~~LOC~~ SOAJ: 240.0000 mg | Freq: Once | SUBCUTANEOUS | 0 refills | Status: AC

## 2022-02-05 MED ORDER — AIMOVIG 140 MG/ML ~~LOC~~ SOAJ: SUBCUTANEOUS | 11 refills | Status: DC

## 2022-02-05 MED ORDER — EMGALITY 120 MG/ML ~~LOC~~ SOAJ: 120.0000 mg | SUBCUTANEOUS | 5 refills | Status: DC

## 2022-02-05 NOTE — Patient Instructions (Addendum)
Due to elevated blood pressure, stop Aimovig and switch to Emgality - 2 injections for first dose, then 1 injection every 28 days thereafter Follow up with your PCP regarding blood pressure

## 2022-02-11 ENCOUNTER — Other Ambulatory Visit: Payer: Self-pay | Admitting: Neurology

## 2022-02-11 DIAGNOSIS — G43009 Migraine without aura, not intractable, without status migrainosus: Secondary | ICD-10-CM

## 2022-03-03 ENCOUNTER — Telehealth: Payer: Self-pay

## 2022-03-03 NOTE — Telephone Encounter (Signed)
Patient Advocate Encounter  Prior Authorization for [employer] 120MG /ML auto-injectors (migraine) has been approved through .    Effective: 02-27-2022 to 05-29-2022

## 2022-04-01 ENCOUNTER — Telehealth: Payer: BC Managed Care – PPO | Admitting: Emergency Medicine

## 2022-04-01 DIAGNOSIS — J329 Chronic sinusitis, unspecified: Secondary | ICD-10-CM | POA: Diagnosis not present

## 2022-04-01 MED ORDER — AMOXICILLIN-POT CLAVULANATE 875-125 MG PO TABS: 1.0000 | ORAL_TABLET | Freq: Two times a day (BID) | ORAL | 0 refills | Status: DC

## 2022-04-01 NOTE — Progress Notes (Signed)

## 2022-04-08 ENCOUNTER — Telehealth: Payer: BC Managed Care – PPO | Admitting: Family Medicine

## 2022-04-08 DIAGNOSIS — U071 COVID-19: Secondary | ICD-10-CM

## 2022-04-08 NOTE — Progress Notes (Signed)
Converted to VV - covid +

## 2022-05-28 ENCOUNTER — Other Ambulatory Visit (HOSPITAL_COMMUNITY): Payer: Self-pay

## 2022-06-27 ENCOUNTER — Telehealth: Payer: Self-pay

## 2022-06-27 NOTE — Telephone Encounter (Signed)
PA request received via CMM for Emgality 120MG /ML auto-injectors (migraine)  PA has been submitted to [employer] and is pending determination  Key: [id number]

## 2022-06-30 NOTE — Telephone Encounter (Signed)
PA has been APPROVED through 06/27/2023.  Approval letter has been attached in patients documents. 

## 2022-08-04 NOTE — Progress Notes (Unsigned)
NEUROLOGY FOLLOW UP OFFICE NOTE  Susan Barber [id number]  Assessment/Plan:   Migraine without aura, without status migrainosus, not intractable   Migraine prevention:  Emgality *** Migraine rescue:  sumatriptan 100mg  with or without naproxen 500mg  (with naproxen if wakes up with migraine) *** Limit use of pain relievers to no more than 2 days out of week to prevent risk of rebound or medication-overuse headache. Keep headache diary Follow up with PCP regarding blood pressure - elevated. Follow up 6 months.   Subjective:  Susan Barber is a 52 year old right-handed woman with hypertension and thyroid disease who follows up for migraines   UPDATE: Due to elevated blood pressure, Aimovig was switched to [employer]  *** Intensity:  7/10 Duration: 1 to 2 hours Frequency:  3 a month (mostly last week prior to next injection). Frequency of abortive medication: 4-5 days a month Rescue protocol:  sumatriptan; sumatriptan with naproxen if wakes up with headache. Current NSAIDS:  Naproxen 500mg   Current analgesics:  none Current triptans:  Sumatriptan 100mg  Current ergotamine:  none Current anti-emetic:  Zofran 8mg  Current muscle relaxants:  Flexeril Current anti-anxiolytic:  none Current sleep aide:  none Current Antihypertensive medications:  lisinopril-HCTZ Current Antidepressant medications:  none Current Anticonvulsant medications:  none Current anti-CGRP:  Emgality  Current Vitamins/Herbal/Supplements:  none Current Antihistamines/Decongestants:  none Other therapy:  none Hormone/birth control:  none Other medications:  none   Caffeine:  1 cup of coffee daily Alcohol:  No Smoker:  No Diet:  Mediterranean diet. Drinks over 100 oz water daily Exercise:  Cardio and weight lifting 5 days a week Depression:  no; Anxiety:  no Other pain:  Low back pain Sleep:  4-5 hours a night   HISTORY: Onset:  20s Location:  Unilateral (either side), rarely in back of  head and neck Quality:  Throbbing if severe Initial intensity:  7/10.  She denies new headache, thunderclap headache or severe headache that wakes her from sleep. Aura:  no Premonitory Phase:  no Postdrome:  lethargy Associated symptoms:  Photophobia, phonophobia, vertigo, sometimes nausea.  She denies associated visual disturbance, autonomic symptoms or unilateral numbness or weakness. Initial duration:  If sumatriptan take early, then an hour; if wakes up with a migraine, it may take 2 hours Initial frequency:  Initially 10 to 12 days a month Initial frequency of abortive medication:3 or 4 times a month. Triggers:  Salt, emotional stress, menses Relieving factors:  None Activity:  aggravates   Past NSAIDS:  Advil Past analgesics:  Tylenol, Excedrin Past abortive triptans:  none Past abortive ergotamine:  none Past muscle relaxants:  none Past anti-emetic:  none Past antihypertensive medications:  none Past antidepressant medications:  nortriptyline (elevated blood pressure) Past anticonvulsant medications:  topiramate 100mg  (effective but caused paresthesias), zonisamide 100mg  Past anti-CGRP:  Aimovig (elevated blood pressure) Past vitamins/Herbal/Supplements:  magnesium 400mg , riboflavin 400mg , coenzyme Q10 300mg , D Past antihistamines/decongestants:  none Other past therapies:  Continuous hormone therapy   Family history of headache:  Grandmother, grandmother's sister, her 3 children  PAST MEDICAL HISTORY: Past Medical History:  Diagnosis Date   Hypertension    Migraines    Thyroid disease     MEDICATIONS: Current Outpatient Medications on File Prior to Visit  Medication Sig Dispense Refill   amoxicillin-clavulanate (AUGMENTIN) 875-125 MG tablet Take 1 tablet by mouth every 12 (twelve) hours. 14 tablet 0   Galcanezumab-gnlm (EMGALITY) 120 MG/ML SOAJ Inject 120 mg into the skin every 28 (twenty-eight) days. 1.12 mL 5  lisinopril-hydrochlorothiazide (ZESTORETIC) 20-12.5  MG tablet Take 1 tablet by mouth daily. 90 tablet 3   naproxen (NAPROSYN) 500 MG tablet TAKE 1 TABLET (500 MG TOTAL) BY MOUTH EVERY 12 (TWELVE) HOURS AS NEEDED. 20 tablet 3   SUMAtriptan (IMITREX) 100 MG tablet May repeat in 2 hours if headache persists or recurs. 10 tablet 11   No current facility-administered medications on file prior to visit.     ALLERGIES: Allergies  Allergen Reactions   Beeswax Other (See Comments)   Codeine Nausea And Vomiting    Headaches.    FAMILY HISTORY: Family History  Problem Relation Age of Onset   Depression Mother    Diabetes Mother    Hyperlipidemia Mother    Hypertension Mother    Obesity Mother    Dementia Mother    Renal Disease Mother    Alcohol abuse Father    Hyperlipidemia Maternal Grandmother    Hypertension Maternal Grandmother    Cancer Maternal Grandfather        lymphoma   Heart attack Maternal Grandfather    Depression Maternal Grandfather    Hyperlipidemia Maternal Grandfather    Hypertension Maternal Grandfather       Objective:  *** General: No acute distress.  Patient appears well-groomed.   Head:  Normocephalic/atraumatic Eyes:  Fundi examined but not visualized Neck: supple, no paraspinal tenderness, full range of motion Heart:  Regular rate and rhythm Neurological Exam: ***   Shon Millet, DO  CC: William Hamburger, NP

## 2022-08-05 ENCOUNTER — Encounter: Payer: Self-pay | Admitting: Neurology

## 2022-08-05 ENCOUNTER — Ambulatory Visit: Payer: BC Managed Care – PPO | Admitting: Neurology

## 2022-08-05 DIAGNOSIS — G43009 Migraine without aura, not intractable, without status migrainosus: Secondary | ICD-10-CM | POA: Diagnosis not present

## 2022-08-05 MED ORDER — NAPROXEN 500 MG PO TABS
500.0000 mg | ORAL_TABLET | Freq: Two times a day (BID) | ORAL | 5 refills | Status: DC | PRN
Start: 2022-08-05 — End: 2023-05-14

## 2022-08-05 MED ORDER — RIZATRIPTAN BENZOATE 10 MG PO TABS: 10.0000 mg | ORAL_TABLET | ORAL | 5 refills | Status: DC | PRN

## 2022-08-05 NOTE — Patient Instructions (Signed)
Emgality Instead of sumatriptan, try rizatriptan.  Let me know if it doesn't cause heart racing and still effective.  Use naproxen as well if wake up with migraine Limit use of pain relievers to no more than 2 days out of week to prevent risk of rebound or medication-overuse headache. Keep headache diary

## 2022-10-10 ENCOUNTER — Ambulatory Visit: Payer: BC Managed Care – PPO | Admitting: Family Medicine

## 2022-11-04 ENCOUNTER — Ambulatory Visit (HOSPITAL_BASED_OUTPATIENT_CLINIC_OR_DEPARTMENT_OTHER): Payer: BC Managed Care – PPO | Admitting: Family Medicine

## 2022-11-04 VITALS — BP 170/81 | HR 87 | Ht 65.0 in | Wt 250.0 lb

## 2022-11-04 DIAGNOSIS — G4700 Insomnia, unspecified: Secondary | ICD-10-CM | POA: Diagnosis not present

## 2022-11-04 DIAGNOSIS — Z6839 Body mass index (BMI) 39.0-39.9, adult: Secondary | ICD-10-CM

## 2022-11-04 DIAGNOSIS — R7303 Prediabetes: Secondary | ICD-10-CM

## 2022-11-04 DIAGNOSIS — G43901 Migraine, unspecified, not intractable, with status migrainosus: Secondary | ICD-10-CM | POA: Diagnosis not present

## 2022-11-04 DIAGNOSIS — Z7689 Persons encountering health services in other specified circumstances: Secondary | ICD-10-CM

## 2022-11-04 DIAGNOSIS — I10 Essential (primary) hypertension: Secondary | ICD-10-CM

## 2022-11-04 LAB — POCT GLYCOSYLATED HEMOGLOBIN (HGB A1C): Hemoglobin A1C: 5.3 % (ref 4.0–5.6)

## 2022-11-04 MED ORDER — LOSARTAN POTASSIUM-HCTZ 100-25 MG PO TABS
1.0000 | ORAL_TABLET | Freq: Every day | ORAL | 3 refills | Status: AC
Start: 2022-11-04 — End: ?

## 2022-11-04 MED ORDER — LIRAGLUTIDE -WEIGHT MANAGEMENT 18 MG/3ML ~~LOC~~ SOPN
0.6000 mg | PEN_INJECTOR | Freq: Every day | SUBCUTANEOUS | 0 refills | Status: DC
Start: 2022-11-04 — End: 2022-12-02

## 2022-11-04 NOTE — Progress Notes (Unsigned)
New Patient Office Visit  Subjective    Patient ID: Susan Barber, female    DOB: [birth year]  Age: 52 y.o. MRN: [id number]  HPI Susan Barber is a 52 year-old female who presents to establish care. She has a past medical history of HTN, morbid obesity, anxiety, and migraines.   Former PCP: Dec 2023  HTN: has been out of medication for about 2 weeks  Losartan-hydrochlorothiazide 100-25mg  daily   Weight loss: trying to get more protein, less sugar and carbs   Chronic migraines: sees neurologist   Outpatient Encounter Medications as of 11/04/2022  Medication Sig   Liraglutide -Weight Management 18 MG/3ML SOPN Inject 0.6 mg into the skin daily.   losartan-hydrochlorothiazide (HYZAAR) 100-25 MG tablet Take 1 tablet by mouth daily.   Galcanezumab-gnlm (EMGALITY) 120 MG/ML SOAJ Inject 120 mg into the skin every 28 (twenty-eight) days.   naproxen (NAPROSYN) 500 MG tablet Take 1 tablet (500 mg total) by mouth every 12 (twelve) hours as needed.   rizatriptan (MAXALT) 10 MG tablet Take 1 tablet (10 mg total) by mouth as needed for migraine. May repeat in 2 hours if needed.  Maximum 2 tablets in 24 hours.   [DISCONTINUED] amoxicillin-clavulanate (AUGMENTIN) 875-125 MG tablet Take 1 tablet by mouth every 12 (twelve) hours.   [DISCONTINUED] lisinopril-hydrochlorothiazide (ZESTORETIC) 20-12.5 MG tablet Take 1 tablet by mouth daily.   [DISCONTINUED] SUMAtriptan (IMITREX) 100 MG tablet May repeat in 2 hours if headache persists or recurs.   No facility-administered encounter medications on file as of 11/04/2022.    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   Heart murmur    As a child   Hypertension    Migraines     Past Surgical History:  Procedure Laterality Date   CESAREAN SECTION     TUBAL LIGATION  09/08/05   During c-section   WISDOM TOOTH  EXTRACTION      Family History  Problem Relation Age of Onset   Depression Mother    Diabetes Mother    Hyperlipidemia Mother    Hypertension Mother    Obesity Mother    Dementia Mother    Renal Disease Mother    Anxiety disorder Mother    Asthma Mother    COPD Mother    Alcohol abuse Father    Hyperlipidemia Maternal Grandmother    Hypertension Maternal Grandmother    Diabetes Maternal Grandmother    Cancer Maternal Grandfather        lymphoma   Heart attack Maternal Grandfather    Depression Maternal Grandfather    Hyperlipidemia Maternal Grandfather    Hypertension Maternal Grandfather    Diabetes Maternal Grandfather    Review of Systems  Constitutional:  Negative for malaise/fatigue and weight loss.  Eyes:  Negative for blurred vision and double vision.  Respiratory:  Negative for cough and shortness of breath.   Cardiovascular:  Negative for chest pain, palpitations and leg swelling.  Gastrointestinal:  Negative for abdominal pain, nausea and vomiting.  Musculoskeletal:  Negative for myalgias.  Neurological:  Positive for headaches (migraines). Negative for dizziness and weakness.  Psychiatric/Behavioral:  Negative for depression, memory loss and suicidal ideas. The patient has insomnia. The patient is not nervous/anxious.     Objective    BP (!) 170/81   Pulse 87   Ht  5\' 5"  (1.651 m)   Wt 250 lb (113.4 kg)   LMP 08/18/2017 (Exact Date)   SpO2 100%   BMI 41.60 kg/m   Physical Exam Constitutional:      Appearance: Normal appearance. She is obese.  Cardiovascular:     Rate and Rhythm: Normal rate and regular rhythm.     Pulses: Normal pulses.     Heart sounds: Normal heart sounds.  Pulmonary:     Effort: Pulmonary effort is normal.     Breath sounds: Normal breath sounds.  Neurological:     Mental Status: She is alert.  Psychiatric:        Mood and Affect: Mood normal.        Behavior: Behavior normal.        Thought Content: Thought content normal.         Judgment: Judgment normal.     Assessment & Plan:   1. Encounter to establish care Patient is a pleasant 52 year-old female who presents today to establish care with new primary care provider. Reviewed the past medical history, family history, social history, surgical history, and allergies today- updates made as indicated. She has concerns about her HTN and is interested in weight loss medication.   2. Migraine with status migrainosus, not intractable, unspecified migraine type Patient is followed by neurology. Currently taking Emgality Q28 days, rizatriptan 10mg  tablet PRN, and naproxen Q12 hours PRN. She reports her migraines are well controlled.    3. Primary hypertension Patient presents today with elevated blood pressure. Patient in no acute distress and is well-appearing. Denies chest pain, shortness of breath, lower extremity edema, vision changes, headaches. Cardiovascular exam with heart regular rate and rhythm. Normal heart sounds, no murmurs present. No lower extremity edema present. Lungs clear to auscultation bilaterally. Patient is currently taking Losartan-hydrochlorothiazide 100-25mg  daily. Refills provided today. Follow-up in 6 weeks.    - losartan-hydrochlorothiazide (HYZAAR) 100-25 MG tablet; Take 1 tablet by mouth daily.  Dispense: 90 tablet; Refill: 3  4. Insomnia, unspecified type Patient reports she has a long-standing history with insomnia. Discussed benefits of nightly sleep routine. Advised patient to return to office if she would like to proceed with pharmacotherapy.   5. Morbid obesity (HCC) Patient discussed weight loss medication. Previously has utilized GLP-1, unsure about which medication. She would like to resume medication to assist with lifestyle modifications. Reviewed potential side effects with this class of medication, discussed appropriate administration as well as gradual titration moving forward. We will look to initiate GLP-1 receptor agonist,  initially proceed with Saxenda. If she is able to start medication, recommend that she schedule follow-up in about 4 weeks after starting.   - Liraglutide -Weight Management 18 MG/3ML SOPN; Inject 0.6 mg into the skin daily.  Dispense: 3 mL; Refill: 0  6. Prediabetes Patient reports she has a history of pre-diabetes. Unable to review records. Will obtain POCT A1c today. Hemoglobin A1c is 5.3- within normal limits.  - POCT HgB A1C   Return in about 6 weeks (around 12/16/2022) for Physical with fasting labs.   Alyson Reedy, FNP

## 2022-12-01 ENCOUNTER — Encounter (HOSPITAL_BASED_OUTPATIENT_CLINIC_OR_DEPARTMENT_OTHER): Payer: Self-pay | Admitting: Family Medicine

## 2022-12-02 MED ORDER — LIRAGLUTIDE -WEIGHT MANAGEMENT 18 MG/3ML ~~LOC~~ SOPN: 1.2000 mg | PEN_INJECTOR | Freq: Every day | SUBCUTANEOUS | 1 refills | Status: DC

## 2022-12-02 MED ORDER — METFORMIN HCL 500 MG PO TABS: 500.0000 mg | ORAL_TABLET | Freq: Every day | ORAL | 2 refills | Status: DC

## 2022-12-04 ENCOUNTER — Other Ambulatory Visit (HOSPITAL_BASED_OUTPATIENT_CLINIC_OR_DEPARTMENT_OTHER): Payer: Self-pay | Admitting: Family Medicine

## 2022-12-12 ENCOUNTER — Other Ambulatory Visit: Payer: Self-pay | Admitting: Neurology

## 2022-12-17 ENCOUNTER — Encounter (HOSPITAL_BASED_OUTPATIENT_CLINIC_OR_DEPARTMENT_OTHER): Payer: BC Managed Care – PPO | Admitting: Family Medicine

## 2022-12-19 ENCOUNTER — Other Ambulatory Visit: Payer: Self-pay | Admitting: Family Medicine

## 2022-12-19 DIAGNOSIS — Z1212 Encounter for screening for malignant neoplasm of rectum: Secondary | ICD-10-CM

## 2022-12-19 DIAGNOSIS — Z1211 Encounter for screening for malignant neoplasm of colon: Secondary | ICD-10-CM

## 2023-02-05 ENCOUNTER — Encounter (HOSPITAL_BASED_OUTPATIENT_CLINIC_OR_DEPARTMENT_OTHER): Payer: Self-pay | Admitting: Family Medicine

## 2023-02-06 ENCOUNTER — Ambulatory Visit: Payer: BC Managed Care – PPO | Admitting: Neurology

## 2023-02-14 ENCOUNTER — Telehealth: Payer: BC Managed Care – PPO | Admitting: Family Medicine

## 2023-02-14 DIAGNOSIS — J019 Acute sinusitis, unspecified: Secondary | ICD-10-CM | POA: Diagnosis not present

## 2023-02-14 MED ORDER — AMOXICILLIN-POT CLAVULANATE 875-125 MG PO TABS: 1.0000 | ORAL_TABLET | Freq: Two times a day (BID) | ORAL | 0 refills | Status: AC

## 2023-02-14 MED ORDER — FLUTICASONE PROPIONATE 50 MCG/ACT NA SUSP: 2.0000 | Freq: Every day | NASAL | 0 refills | Status: DC

## 2023-02-14 NOTE — Progress Notes (Signed)

## 2023-02-22 ENCOUNTER — Other Ambulatory Visit: Payer: Self-pay | Admitting: Neurology

## 2023-05-12 ENCOUNTER — Other Ambulatory Visit: Payer: Self-pay | Admitting: Neurology

## 2023-05-12 DIAGNOSIS — G43009 Migraine without aura, not intractable, without status migrainosus: Secondary | ICD-10-CM

## 2023-06-29 ENCOUNTER — Telehealth: Payer: Self-pay | Admitting: Pharmacy Technician

## 2023-06-29 NOTE — Telephone Encounter (Signed)
 Pharmacy Patient Advocate Encounter   Received notification from CoverMyMeds that prior authorization for [facility] 120MG  is required/requested.   Insurance verification completed.   The patient is insured through [employer] .   Per test claim: PA required; PA submitted to above mentioned insurance via CoverMyMeds Key/confirmation #/EOC [id number] Status is pending

## 2023-06-30 ENCOUNTER — Other Ambulatory Visit (HOSPITAL_COMMUNITY): Payer: Self-pay

## 2023-06-30 NOTE — Telephone Encounter (Signed)
 Pharmacy Patient Advocate Encounter  Received notification from [employer] that Prior Authorization for [facility] 120MG  has been APPROVED from 4.14.25 to 4.14.26. Unable to obtain price due to refill too soon rejection, last fill date 3.9.25 next available fill date4.19.25   PA #/Case ID/Reference #: [id number]

## 2023-07-07 ENCOUNTER — Encounter (HOSPITAL_BASED_OUTPATIENT_CLINIC_OR_DEPARTMENT_OTHER): Payer: Self-pay

## 2023-07-21 ENCOUNTER — Other Ambulatory Visit: Payer: Self-pay | Admitting: Neurology

## 2023-08-05 ENCOUNTER — Ambulatory Visit: Payer: BC Managed Care – PPO | Admitting: Neurology

## 2023-09-14 ENCOUNTER — Telehealth: Admitting: Physician Assistant

## 2023-09-14 DIAGNOSIS — H1032 Unspecified acute conjunctivitis, left eye: Secondary | ICD-10-CM

## 2023-09-14 MED ORDER — OFLOXACIN 0.3 % OP SOLN: 1.0000 [drp] | Freq: Four times a day (QID) | OPHTHALMIC | 0 refills | Status: AC

## 2023-09-14 NOTE — Progress Notes (Signed)

## 2023-10-31 ENCOUNTER — Telehealth: Admitting: Family Medicine

## 2023-10-31 DIAGNOSIS — J019 Acute sinusitis, unspecified: Secondary | ICD-10-CM

## 2023-10-31 MED ORDER — AMOXICILLIN-POT CLAVULANATE 875-125 MG PO TABS: 1.0000 | ORAL_TABLET | Freq: Two times a day (BID) | ORAL | 0 refills | Status: AC

## 2023-10-31 NOTE — Progress Notes (Signed)

## 2023-11-29 ENCOUNTER — Other Ambulatory Visit (HOSPITAL_BASED_OUTPATIENT_CLINIC_OR_DEPARTMENT_OTHER): Payer: Self-pay | Admitting: Family Medicine

## 2023-11-29 DIAGNOSIS — I10 Essential (primary) hypertension: Secondary | ICD-10-CM
